# Patient Record
Sex: Female | Born: 2000 | Race: White | Hispanic: No | Marital: Single | Smoking: Never smoker
Health system: Southern US, Community
[De-identification: ages and names within clinical notes are randomized; demographics above are authoritative.]

## PROBLEM LIST (undated history)

## (undated) DIAGNOSIS — F819 Developmental disorder of scholastic skills, unspecified: Secondary | ICD-10-CM

## (undated) DIAGNOSIS — H9325 Central auditory processing disorder: Secondary | ICD-10-CM

## (undated) DIAGNOSIS — F32A Depression, unspecified: Secondary | ICD-10-CM

## (undated) DIAGNOSIS — F419 Anxiety disorder, unspecified: Secondary | ICD-10-CM

## (undated) DIAGNOSIS — M4120 Other idiopathic scoliosis, site unspecified: Secondary | ICD-10-CM

## (undated) DIAGNOSIS — F329 Major depressive disorder, single episode, unspecified: Secondary | ICD-10-CM

## (undated) HISTORY — PX: ADENOIDECTOMY: SUR15

## (undated) HISTORY — DX: Anxiety disorder, unspecified: F41.9

## (undated) HISTORY — DX: Developmental disorder of scholastic skills, unspecified: F81.9

## (undated) HISTORY — PX: WISDOM TOOTH EXTRACTION: SHX21

## (undated) HISTORY — DX: Central auditory processing disorder: H93.25

## (undated) HISTORY — DX: Other idiopathic scoliosis, site unspecified: M41.20

---
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
## 2019-01-04 ENCOUNTER — Ambulatory Visit (INDEPENDENT_AMBULATORY_CARE_PROVIDER_SITE_OTHER): Payer: BC Managed Care – PPO | Admitting: Family Medicine

## 2019-01-04 ENCOUNTER — Encounter: Payer: Self-pay | Admitting: Family Medicine

## 2019-01-04 ENCOUNTER — Other Ambulatory Visit: Payer: Self-pay

## 2019-01-04 VITALS — Temp 98.3°F

## 2019-01-04 DIAGNOSIS — F419 Anxiety disorder, unspecified: Secondary | ICD-10-CM

## 2019-01-04 NOTE — Progress Notes (Signed)
Virtual Visit via Video Note  I connected with Melissa EricNancy S Daniel   on 01/04/19 at 10:30 AM EDT by a video enabled telemedicine application and verified that I am speaking with the correct person using two identifiers.  Location patient: home Location provider:work office Persons participating in the virtual visit: patient, provider  I discussed the limitations of evaluation and management by telemedicine and the availability of in person appointments. The patient expressed understanding and agreed to proceed.   Melissa Ericancy S Daniel DOB: 04/25/2001 Encounter date: 01/04/2019  This is a 18 y.o. female who presents to establish care. No chief complaint on file.  Had virtual visit 11/17/18 with [facility]: started on tri-sprintec at that time to help with acne,dysmenrrhea, and for pregnancy prevention.  History of present illness: Anxiety: on prozac 40mg  daily. Follows with Franchot ErichsenKim Dansie (psychiatry) and has been following with her for 6 years. Doing better on this higher dose. Anxiety wasn't treated enough. COVID has made anxiety worse. Just had fear all the time about having symptoms and being sick. Was tested a couple of weeks ago and this was negative. Feeling well now.   Has had some allergy sx - nasal congestion, little cough. Thinks pollen was worse this year. Uses benadryl or claritin at night if needed.   She is planning to start college in Fall. 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   Doing well with the trisprintec - no problems.   Mild scoliosis - states that she just had xray the other day and that the scoliosis was doing better but she might have pinched nerve. Sees doctor in [place] for this. Ortho doctor.   Does exercise regularly - walk/job daily and yoga.    Past Medical History:  Diagnosis Date  . Anxiety   . Central auditory processing disorder    audiological, OT, full psych eval in 2008-2009  . Depression   . Idiopathic scoliosis    mild  . Problems  with learning    CAP, has tutor   Past Surgical History:  Procedure Laterality Date  . ADENOIDECTOMY    . TYMPANOSTOMY TUBE PLACEMENT  2003  . WISDOM TOOTH EXTRACTION     No Known Allergies Current Meds  Medication Sig  . FLUoxetine HCl (PROZAC PO) Take 60 mg by mouth daily.  Marland Kitchen. ibuprofen (ADVIL,MOTRIN) 200 MG tablet Take 200 mg by mouth as needed. Reported on 01/03/2016  . Norgestimate-Ethinyl Estradiol Triphasic (TRI-SPRINTEC) 0.18/0.215/0.25 MG-35 MCG tablet Take 1 tablet by mouth daily.  . Omega-3 Fatty Acids (FISH OIL PO) Take by mouth daily.  . [DISCONTINUED] FLUoxetine (PROZAC) 40 MG capsule Take 40 mg by mouth daily.   Social History   Tobacco Use  . Smoking status: Never Smoker  . Smokeless tobacco: Never Used  Substance Use Topics  . Alcohol use: Yes    Comment: rare 1 drink   Family History  Problem Relation Age of Onset  . Hypertension Father   . Liver disease Father   . Seizures Father   . Ankylosing spondylitis Father   . Lupus Paternal Grandmother   . Alzheimer's disease Maternal Grandmother      Review of Systems  Constitutional: Negative for chills, fatigue and fever.  Respiratory: Negative for cough, chest tightness, shortness of breath and wheezing.   Cardiovascular: Negative for chest pain, palpitations and leg swelling.    Objective:  Temp 98.3 F (36.8 C) Comment: taken by pt-jaf  LMP 12/09/2018       BP  Readings from Last 3 Encounters:  11/27/18 104/63  02/17/18 98/60  11/04/17 100/76 (9 %, Z = -1.32 /  85 %, Z = 1.05)*   *BP percentiles are based on the 2017 AAP Clinical Practice Guideline for girls   Wt Readings from Last 3 Encounters:  02/17/18 162 lb 1.6 oz (73.5 kg) (91 %, Z= 1.31)*  11/04/17 153 lb 8 oz (69.6 kg) (87 %, Z= 1.12)*  03/04/17 149 lb 9.6 oz (67.9 kg) (85 %, Z= 1.05)*   * Growth percentiles are based on CDC (Girls, 2-20 Years) data.    EXAM:  GENERAL: alert, oriented, appears well and in no acute  distress  HEENT: atraumatic, conjunctiva clear, no obvious abnormalities on inspection of external nose and ears  NECK: normal movements of the head and neck  LUNGS: on inspection no signs of respiratory distress, breathing rate appears normal, no obvious gross SOB, gasping or wheezing  CV: no obvious cyanosis  MS: moves all visible extremities without noticeable abnormality  PSYCH/NEURO: pleasant and cooperative, no obvious depression or anxiety, speech and thought processing grossly intact  Assessment/Plan: 1. Anxiety Doing well with Prozac.  See psychiatry on a regular basis.  Also encouraged her to look into the "insight timer" app which may help with meditation/anxiety.  Keep up with exercise and yoga as this is also a good stress relief.  She does need a physical prior to entering school.  She is going to look for the paperwork for this and we will try to get this scheduled before start of the school year in August. Looks like only immunization she is due for is MCV, but I have not seen paperwork to see if there are other requirements before starting school.   Return for physical prior to starting school (patient request).   I discussed the assessment and treatment plan with the patient. The patient was provided an opportunity to ask questions and all were answered. The patient agreed with the plan and demonstrated an understanding of the instructions.   The patient was advised to call back or seek an in-person evaluation if the symptoms worsen or if the condition fails to improve as anticipated.  I provided 15 minutes of non-face-to-face time during this encounter.   Micheline Rough, MD

## 2019-01-07 NOTE — Progress Notes (Signed)
yes

## 2019-01-09 ENCOUNTER — Telehealth: Payer: Self-pay | Admitting: *Deleted

## 2019-01-09 NOTE — Telephone Encounter (Signed)
-----   Message from Caren Macadam, MD sent at 01/07/2019 12:04 PM EDT -----   ----- Message ----- From: Agnes Lawrence, CMA Sent: 01/04/2019  11:02 AM EDT To: Caren Macadam, MD  There are no openings available.  Is it Ok to use a same day appt for this pt? Wendie Simmer  ----- Message ----- From: Caren Macadam, MD Sent: 01/04/2019  10:57 AM EDT To: Agnes Lawrence, CMA  Please set up school physical - she needs this before 3rd week in august.

## 2019-01-09 NOTE — Telephone Encounter (Signed)
I left a detailed message I scheduled an appt for 8/21 to arrive at 3pm for a physical as this is the only opening available.  I asked that the pt call the office to cancel if this is not a good day for her.

## 2019-01-12 DIAGNOSIS — F419 Anxiety disorder, unspecified: Secondary | ICD-10-CM | POA: Diagnosis not present

## 2019-01-13 ENCOUNTER — Telehealth: Payer: BC Managed Care – PPO | Admitting: Family

## 2019-01-13 ENCOUNTER — Encounter: Payer: Self-pay | Admitting: Family Medicine

## 2019-01-13 DIAGNOSIS — N898 Other specified noninflammatory disorders of vagina: Secondary | ICD-10-CM

## 2019-01-13 DIAGNOSIS — Z20822 Contact with and (suspected) exposure to covid-19: Secondary | ICD-10-CM

## 2019-01-13 DIAGNOSIS — R109 Unspecified abdominal pain: Secondary | ICD-10-CM

## 2019-01-13 DIAGNOSIS — N9419 Other specified dyspareunia: Secondary | ICD-10-CM

## 2019-01-13 MED ORDER — ALBUTEROL SULFATE HFA 108 (90 BASE) MCG/ACT IN AERS: 2.0000 | INHALATION_SPRAY | RESPIRATORY_TRACT | 2 refills | Status: DC | PRN

## 2019-01-13 MED ORDER — BENZONATATE 100 MG PO CAPS: 100.0000 mg | ORAL_CAPSULE | Freq: Three times a day (TID) | ORAL | 0 refills | Status: DC | PRN

## 2019-01-13 NOTE — Telephone Encounter (Signed)
How about 10:45 on July 29th? Looks like we could squeeze her in. Make sure she arrives ten minutes early and let her know we are squeezing in so to be a little patient with [place].   Please call her back to confirm

## 2019-01-13 NOTE — Progress Notes (Signed)
Thank you for the details you included in the comment boxes. Those details are very helpful in determining the best course of treatment for you and help [place] to provide the best care.  Due to your symptoms, we must perform a physical exam and lab work for safety reasons to ensure the infection is not more serious. This is the standard of care and cannot be performed remotely.   Based on what you shared with me, I feel your condition warrants further evaluation and I recommend that you be seen for a face to face office visit.  NOTE: If you entered your credit card information for this eVisit, you will not be charged. You may see a "hold" on your card for the $35 but that hold will drop off and you will not have a charge processed.  If you are having a true medical emergency please call 911.     For an urgent face to face visit, [facility] has five urgent care centers for your convenience:    [email or web address] to reserve your spot online an avoid wait times  [facility] (New Address!) [address] [place], [place] [address] *Just off [address], across the road from [place] hours of operation: Monday-Friday, 12 PM to 6 PM  Closed Saturday & Sunday   The following sites will take your insurance:  . [facility]    [phone]                  Get Driving Directions  [address], [place] [address] . 10 am to 8 pm Monday-Friday . 12 pm to 8 pm Saturday-Sunday   . [facility] at [place]                  Get Driving Directions  [address], [place], [place] [address] . 8 am to 8 pm Monday-Friday . 9 am to 6 pm Saturday . 11 am to 6 pm Sunday   . [facility] at [place]                  Get Driving Directions   [address] [place], [place] [address] . 8 am to 8 pm Monday-Friday .  8 am to 4 pm Saturday-Sunday    . [facility] at [place]                    Get Driving Directions  [phone]  [address], [place],  [address]  . Monday-Friday, 12 PM to 6 PM    Your e-visit answers were reviewed by a board certified advanced clinical practitioner to complete your personal care plan.  Thank you for using e-Visits.

## 2019-01-13 NOTE — Progress Notes (Signed)
Greater than 5 minutes, yet less than 10 minutes of time have been spent researching, coordinating, and implementing care for this patient today.  Thank you for the details you included in the comment boxes. Those details are very helpful in determining the best course of treatment for you and help us to provide the best care.  E-Visit for Corona Virus Screening   Your current symptoms could be consistent with the coronavirus.  Many health care providers can now test patients at their office but not all are.  [facility] has multiple testing sites. For information on our COVID testing locations and hours go to [email or web address]  Please quarantine yourself while awaiting your test results.  We are enrolling you in our MyChart Home Montioring for COVID19 . Daily you will receive a questionnaire within the MyChart website. Our COVID 19 response team willl be monitoriing your responses daily.    COVID-19 is a respiratory illness with symptoms that are similar to the flu. Symptoms are typically mild to moderate, but there have been cases of severe illness and death due to the virus. The following symptoms may appear 2-14 days after exposure: . Fever . Cough . Shortness of breath or difficulty breathing . Chills . Repeated shaking with chills . Muscle pain . Headache . Sore throat . New loss of taste or smell . Fatigue . Congestion or runny nose . Nausea or vomiting . Diarrhea  It is vitally important that if you feel that you have an infection such as this virus or any other virus that you stay home and away from places where you may spread it to others.  You should self-quarantine for 14 days if you have symptoms that could potentially be coronavirus or have been in close contact a with a person diagnosed with COVID-19 within the last 2 weeks. You should avoid contact with people age 65 and older.   You should wear a mask or cloth face covering over your nose and  mouth if you must be around other people or animals, including pets (even at home). Try to stay at least 6 feet away from other people. This will protect the people around you.  You can use medication such as A prescription cough medication called Tessalon Perles 100 mg. You may take 1-2 capsules every 8 hours as needed for cough and A prescription inhaler called Albuterol MDI 90 mcg /actuation 2 puffs every 4 hours as needed for shortness of breath, wheezing, cough  You may also take acetaminophen (Tylenol) as needed for fever.   Reduce your risk of any infection by using the same precautions used for avoiding the common cold or flu:  . Wash your hands often with soap and warm water for at least 20 seconds.  If soap and water are not readily available, use an alcohol-based hand sanitizer with at least 60% alcohol.  . If coughing or sneezing, cover your mouth and nose by coughing or sneezing into the elbow areas of your shirt or coat, into a tissue or into your sleeve (not your hands). . Avoid shaking hands with others and consider head nods or verbal greetings only. . Avoid touching your eyes, nose, or mouth with unwashed hands.  . Avoid close contact with people who are sick. . Avoid places or events with large numbers of people in one location, like concerts or sporting events. . Carefully consider travel plans you have or are making. . If you are planning any travel outside or inside the [place],   visit the CDC's Travelers' Health webpage for the latest health notices. . If you have some symptoms but not all symptoms, continue to monitor at home and seek medical attention if your symptoms worsen. . If you are having a medical emergency, call 911.  HOME CARE . Only take medications as instructed by your medical team. . Drink plenty of fluids and get plenty of rest. . A steam or ultrasonic humidifier can help if you have congestion.   GET HELP RIGHT AWAY IF YOU HAVE EMERGENCY WARNING SIGNS** FOR  COVID-19. If you or someone is showing any of these signs seek emergency medical care immediately. Call 911 or proceed to your closest emergency facility if: . You develop worsening high fever. . Trouble breathing . Bluish lips or face . Persistent pain or pressure in the chest . New confusion . Inability to wake or stay awake . You cough up blood. . Your symptoms become more severe  **This list is not all possible symptoms. Contact your medical provider for any symptoms that are sever or concerning to you.   MAKE SURE YOU   Understand these instructions.  Will watch your condition.  Will get help right away if you are not doing well or get worse.  Your e-visit answers were reviewed by a board certified advanced clinical practitioner to complete your personal care plan.  Depending on the condition, your plan could have included both over the counter or prescription medications.  If there is a problem please reply once you have received a response from your provider.  Your safety is important to us.  If you have drug allergies check your prescription carefully.    You can use MyChart to ask questions about today's visit, request a non-urgent call back, or ask for a work or school excuse for 24 hours related to this e-Visit. If it has been greater than 24 hours you will need to follow up with your provider, or enter a new e-Visit to address those concerns. You will get an e-mail in the next two days asking about your experience.  I hope that your e-visit has been valuable and will speed your recovery. Thank you for using e-visits.    

## 2019-01-14 DIAGNOSIS — R51 Headache: Secondary | ICD-10-CM | POA: Diagnosis not present

## 2019-01-14 DIAGNOSIS — Z20828 Contact with and (suspected) exposure to other viral communicable diseases: Secondary | ICD-10-CM | POA: Diagnosis not present

## 2019-01-14 DIAGNOSIS — J029 Acute pharyngitis, unspecified: Secondary | ICD-10-CM | POA: Diagnosis not present

## 2019-01-14 DIAGNOSIS — R05 Cough: Secondary | ICD-10-CM | POA: Diagnosis not present

## 2019-01-16 NOTE — Telephone Encounter (Signed)
Message sent to the pt via Mychart. 

## 2019-01-18 ENCOUNTER — Ambulatory Visit (INDEPENDENT_AMBULATORY_CARE_PROVIDER_SITE_OTHER): Payer: BC Managed Care – PPO | Admitting: Family Medicine

## 2019-01-18 ENCOUNTER — Other Ambulatory Visit: Payer: Self-pay

## 2019-01-18 ENCOUNTER — Encounter: Payer: Self-pay | Admitting: Family Medicine

## 2019-01-18 VITALS — BP 100/50 | HR 89 | Temp 97.2°F | Ht 66.25 in | Wt 151.2 lb

## 2019-01-18 DIAGNOSIS — F419 Anxiety disorder, unspecified: Secondary | ICD-10-CM

## 2019-01-18 DIAGNOSIS — M41124 Adolescent idiopathic scoliosis, thoracic region: Secondary | ICD-10-CM

## 2019-01-18 DIAGNOSIS — N946 Dysmenorrhea, unspecified: Secondary | ICD-10-CM

## 2019-01-18 DIAGNOSIS — Z0000 Encounter for general adult medical examination without abnormal findings: Secondary | ICD-10-CM

## 2019-01-18 DIAGNOSIS — Z23 Encounter for immunization: Secondary | ICD-10-CM | POA: Diagnosis not present

## 2019-01-18 NOTE — Progress Notes (Signed)
Brien Few DOB: [birth year] Encounter date: 01/18/2019  This is a 18 y.o. female who presents for complete physical   History of present illness/Additional concerns: Needing physical before return to school. Due for meningitis conjugate; mcv b if desired - would like both today.   Returns to school soon. Interested in Community education officer.   Had eye exam in January.   Saw densist a couple of weeks ago.   Exercising regularly.   Past Medical History:  Diagnosis Date  . Anxiety   . Central auditory processing disorder    audiological, OT, full psych eval in 2008-2009  . Depression   . Idiopathic scoliosis    mild  . Problems with learning    CAP, has tutor   Past Surgical History:  Procedure Laterality Date  . ADENOIDECTOMY    . TYMPANOSTOMY TUBE PLACEMENT  2003  . WISDOM TOOTH EXTRACTION     No Known Allergies Current Meds  Medication Sig  . FLUoxetine HCl (PROZAC PO) Take 60 mg by mouth daily.  Marland Kitchen ibuprofen (ADVIL,MOTRIN) 200 MG tablet Take 200 mg by mouth as needed. Reported on 01/03/2016  . Norgestimate-Ethinyl Estradiol Triphasic (TRI-SPRINTEC) 0.18/0.215/0.25 MG-35 MCG tablet Take 1 tablet by mouth daily.  . Omega-3 Fatty Acids (FISH OIL PO) Take by mouth daily.   Social History   Tobacco Use  . Smoking status: Never Smoker  . Smokeless tobacco: Never Used  Substance Use Topics  . Alcohol use: Yes    Comment: rare 1 drink   Family History  Problem Relation Age of Onset  . Hypertension Father   . Liver disease Father   . Seizures Father   . Ankylosing spondylitis Father   . Lupus Paternal Grandmother   . Alzheimer's disease Maternal Grandmother      Review of Systems  Constitutional: Negative for activity change, appetite change, chills, fatigue, fever and unexpected weight change.  HENT: Negative for congestion, ear pain, hearing loss, sinus pressure, sinus pain, sore throat and trouble swallowing.   Eyes: Negative for pain and visual disturbance.   Respiratory: Negative for cough, chest tightness, shortness of breath and wheezing.   Cardiovascular: Negative for chest pain, palpitations and leg swelling.  Gastrointestinal: Negative for abdominal pain, blood in stool, constipation, diarrhea, nausea and vomiting.  Genitourinary: Negative for difficulty urinating and menstrual problem.  Musculoskeletal: Positive for back pain (gets back pain on and off. has followed with scoliosis specialist. no recommended treatment). Negative for arthralgias.  Skin: Negative for rash.  Neurological: Negative for dizziness, weakness, numbness and headaches.  Hematological: Negative for adenopathy. Does not bruise/bleed easily.  Psychiatric/Behavioral: Negative for sleep disturbance and suicidal ideas. The patient is not nervous/anxious.     CBC:  Lab Results  Component Value Date   WBC 7.5 11/21/2015   HGB 14.2 11/21/2015   HCT 41.6 11/21/2015   MCH 29.4 11/21/2012   MCHC 34.0 11/21/2015   RDW 12.9 11/21/2015   PLT 265.0 11/21/2015   CMP: Lab Results  Component Value Date   NA 140 11/21/2015   K 4.4 11/21/2015   CL 102 11/21/2015   CO2 29 11/21/2015   GLUCOSE 78 11/21/2015   BUN 20 11/21/2015   CREATININE 0.77 11/21/2015   GFRAA NOT CALCULATED 11/21/2012   CALCIUM 9.7 11/21/2015   PROT 7.3 11/21/2012   BILITOT 0.2 (L) 11/21/2012   ALKPHOS 324 11/21/2012   ALT 14 11/21/2012   AST 22 11/21/2012   LIPID:No results found for: CHOL, TRIG, HDL, LDLCALC, LABVLDL  Objective:  BP (!) 100/50 (BP Location: Left Arm, Patient Position: Sitting, Cuff Size: Normal)   Pulse 89   Temp (!) 97.2 F (36.2 C) (Temporal)   Ht 5' 6.25" (1.683 m)   Wt 151 lb 3.2 oz (68.6 kg)   LMP 01/05/2019 (Exact Date)   SpO2 98%   BMI 24.22 kg/m   Weight: 151 lb 3.2 oz (68.6 kg)   BP Readings from Last 3 Encounters:  01/18/19 (!) 100/50  11/27/18 104/63  02/17/18 98/60   Wt Readings from Last 3 Encounters:  01/18/19 151 lb 3.2 oz (68.6 kg) (83 %, Z=  0.94)*  02/17/18 162 lb 1.6 oz (73.5 kg) (91 %, Z= 1.31)*  11/04/17 153 lb 8 oz (69.6 kg) (87 %, Z= 1.12)*   * Growth percentiles are based on CDC (Girls, 2-20 Years) data.    Physical Exam Constitutional:      General: She is not in acute distress.    Appearance: She is well-developed.  HENT:     Head: Normocephalic and atraumatic.     Right Ear: External ear normal.     Left Ear: External ear normal.     Mouth/Throat:     Pharynx: No oropharyngeal exudate.  Eyes:     Conjunctiva/sclera: Conjunctivae normal.     Pupils: Pupils are equal, round, and reactive to light.  Neck:     Musculoskeletal: Normal range of motion and neck supple.     Thyroid: No thyromegaly.  Cardiovascular:     Rate and Rhythm: Normal rate and regular rhythm.     Heart sounds: Normal heart sounds. No murmur. No friction rub. No gallop.   Pulmonary:     Effort: Pulmonary effort is normal.     Breath sounds: Normal breath sounds.  Abdominal:     General: Bowel sounds are normal. There is no distension.     Palpations: Abdomen is soft. There is no mass.     Tenderness: There is no abdominal tenderness. There is no guarding.     Hernia: No hernia is present.  Musculoskeletal: Normal range of motion.        General: No tenderness.     Thoracic back: She exhibits deformity.     Comments: Mild thoracic scoliosis noted  Lymphadenopathy:     Cervical: No cervical adenopathy.  Skin:    General: Skin is warm and dry.     Findings: No rash.  Neurological:     Mental Status: She is alert and oriented to person, place, and time.     Deep Tendon Reflexes: Reflexes normal.     Reflex Scores:      Tricep reflexes are 2+ on the right side and 2+ on the left side.      Bicep reflexes are 2+ on the right side and 2+ on the left side.      Brachioradialis reflexes are 2+ on the right side and 2+ on the left side.      Patellar reflexes are 2+ on the right side and 2+ on the left side. Psychiatric:        Speech:  Speech normal.        Behavior: Behavior normal.        Thought Content: Thought content normal.     Assessment/Plan: There are no preventive care reminders to display for this patient. Health Maintenance reviewed.  1. Preventative health care Keep up with exercise and healthy eating. We discussed considerations for school and being aware of mood changes/stressors.  2. Need for meningitis vaccination - Meningococcal MCV4O(Menveo) - Meningococcal B, OMV (Bexsero)  3. Anxiety Has been stable.  Continue with therapist.  4. Adolescent idiopathic scoliosis of thoracic region Encouraged her to consider chiropractor for back discomfort.  She had consider this earlier but was wondering if it would be a good choice of treatment.  I do think it would help with muscle imbalance secondary to scoliosis and also help with home exercises she continued to maintain good support/strength and flexibility of the spine.  5. Dysmenorrhea: should improve on OCP. She has only been on short time.  We discussed trying OCP at least 3 months to see how this affects menstrual cycle.  Encouraged her to consider ibuprofen prior to menses to help prevent cramping.  Suggested 400 to 600 mg twice daily 48 hours prior to first day of cycle.   Return in about 6 months (around 07/21/2019) for Chronic condition visit.  Theodis ShoveJunell Koberlein, MD

## 2019-01-19 ENCOUNTER — Encounter: Payer: Self-pay | Admitting: Family Medicine

## 2019-01-20 DIAGNOSIS — N760 Acute vaginitis: Secondary | ICD-10-CM | POA: Diagnosis not present

## 2019-01-20 DIAGNOSIS — Z113 Encounter for screening for infections with a predominantly sexual mode of transmission: Secondary | ICD-10-CM | POA: Diagnosis not present

## 2019-01-30 ENCOUNTER — Encounter: Payer: Self-pay | Admitting: Family Medicine

## 2019-01-30 DIAGNOSIS — M9901 Segmental and somatic dysfunction of cervical region: Secondary | ICD-10-CM | POA: Diagnosis not present

## 2019-01-30 DIAGNOSIS — M530 Cervicocranial syndrome: Secondary | ICD-10-CM | POA: Diagnosis not present

## 2019-01-30 DIAGNOSIS — M9903 Segmental and somatic dysfunction of lumbar region: Secondary | ICD-10-CM | POA: Diagnosis not present

## 2019-01-30 DIAGNOSIS — M9902 Segmental and somatic dysfunction of thoracic region: Secondary | ICD-10-CM | POA: Diagnosis not present

## 2019-02-01 DIAGNOSIS — M530 Cervicocranial syndrome: Secondary | ICD-10-CM | POA: Diagnosis not present

## 2019-02-01 DIAGNOSIS — M9903 Segmental and somatic dysfunction of lumbar region: Secondary | ICD-10-CM | POA: Diagnosis not present

## 2019-02-01 DIAGNOSIS — M9901 Segmental and somatic dysfunction of cervical region: Secondary | ICD-10-CM | POA: Diagnosis not present

## 2019-02-01 DIAGNOSIS — M9902 Segmental and somatic dysfunction of thoracic region: Secondary | ICD-10-CM | POA: Diagnosis not present

## 2019-02-08 ENCOUNTER — Other Ambulatory Visit: Payer: Self-pay

## 2019-02-08 ENCOUNTER — Ambulatory Visit (HOSPITAL_COMMUNITY)
Admission: EM | Admit: 2019-02-08 | Discharge: 2019-02-08 | Disposition: A | Discharge status: Home / Self Care | Payer: BC Managed Care – PPO | Attending: Family Medicine | Admitting: Family Medicine

## 2019-02-08 ENCOUNTER — Encounter (HOSPITAL_COMMUNITY): Payer: Self-pay

## 2019-02-08 DIAGNOSIS — Z20822 Contact with and (suspected) exposure to covid-19: Secondary | ICD-10-CM

## 2019-02-08 DIAGNOSIS — Z20828 Contact with and (suspected) exposure to other viral communicable diseases: Secondary | ICD-10-CM | POA: Diagnosis not present

## 2019-02-08 NOTE — ED Triage Notes (Signed)
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

## 2019-02-08 NOTE — Discharge Instructions (Signed)
Self isolate until results are back and negative.  Will notify you of any positive findings. You may monitor your results on your MyChart online as well.    Supportive cares- fluids, tylenol, ibuprofen, over the counter cough medications- as needed for any symptoms.  Please go to the er for any shortness of breath , severe chest pain , or otherwise worsening.

## 2019-02-08 NOTE — ED Provider Notes (Signed)
[facility]    CSN: [id number] Arrival date & time: 02/08/19  [id number]      History   Chief Complaint Chief Complaint  Patient presents with  . Covid Exposure    HPI Melissa Daniel is a 18 y.o. female.   Melissa Daniel presents with complaints of exposure to covid-19. She was at her friends house 8/16. They both tested for covid the following day, receiving results yesterday which were positive. Melissa Daniel denies any current symptoms and feels well. No URI symptoms, no gi symptom, no headache, fever. History  Of anxiety, depression, scoliosis.      ROS per HPI, negative if not otherwise mentioned.      Past Medical History:  Diagnosis Date  . Anxiety   . Central auditory processing disorder    audiological, OT, full psych eval in 2008-2009  . Depression   . Idiopathic scoliosis    mild  . Problems with learning    CAP, has tutor    Patient Active Problem List   Diagnosis Date Noted  . Abnormal endocrine laboratory test finding 01/03/2016  . Father with liver failure 12/22/2011  . Central auditory processing disorder   . Adolescent idiopathic scoliosis of thoracic region 11/26/2010  . Anxiety 11/26/2010    Past Surgical History:  Procedure Laterality Date  . ADENOIDECTOMY    . TYMPANOSTOMY TUBE PLACEMENT  2003  . WISDOM TOOTH EXTRACTION      OB History   No obstetric history on file.      Home Medications    Prior to Admission medications   Medication Sig Start Date End Date Taking? Authorizing Provider  FLUoxetine HCl (PROZAC PO) Take 60 mg by mouth daily.    [provider]  ibuprofen (ADVIL,MOTRIN) 200 MG tablet Take 200 mg by mouth as needed. Reported on 01/03/2016    [provider]  Norgestimate-Ethinyl Estradiol Triphasic (TRI-SPRINTEC) 0.18/0.215/0.25 MG-35 MCG tablet Take 1 tablet by mouth daily. 11/17/18   Terressa KoyanagiKim, Hannah R, DO  Omega-3 Fatty Acids (FISH OIL PO) Take by mouth daily.    [provider]     Family History Family History  Problem Relation Age of Onset  . Hypertension Father   . Liver disease Father   . Seizures Father   . Ankylosing spondylitis Father   . Lupus Paternal Grandmother   . Alzheimer's disease Maternal Grandmother     Social History Social History   Tobacco Use  . Smoking status: Never Smoker  . Smokeless tobacco: Never Used  Substance Use Topics  . Alcohol use: Yes    Comment: rare 1 drink  . Drug use: No     Allergies   Patient has no known allergies.   Review of Systems Review of Systems   Physical Exam Triage Vital Signs ED Triage Vitals  Enc Vitals Group     BP 02/08/19 0852 101/68     Pulse Rate 02/08/19 0852 85     Resp 02/08/19 0852 17     Temp 02/08/19 0852 98.7 F (37.1 C)     Temp Source 02/08/19 0852 Oral     SpO2 02/08/19 0852 99 %     Weight --      Height --      Head Circumference --      Peak Flow --      Pain Score 02/08/19 0854 0     Pain Loc --      Pain Edu? --  Excl. in GC? --    No data found.  Updated Vital Signs BP 101/68 (BP Location: Right Arm)   Pulse 85   Temp 98.7 F (37.1 C) (Oral)   Resp 17   LMP 02/02/2019   SpO2 99%    Physical Exam Constitutional:      General: She is not in acute distress.    Appearance: She is well-developed.  Cardiovascular:     Rate and Rhythm: Normal rate.  Pulmonary:     Effort: Pulmonary effort is normal.  Skin:    General: Skin is warm and dry.  Neurological:     Mental Status: She is alert and oriented to person, place, and time.      UC Treatments / Results  Labs (all labs ordered are listed, but only abnormal results are displayed) Labs Reviewed  NOVEL CORONAVIRUS, NAA (HOSPITAL ORDER, SEND-OUT TO REF LAB)    EKG   Radiology No results found.  Procedures Procedures (including critical care time)  Medications Ordered in UC Medications - No data to display  Initial Impression / Assessment and Plan / UC Course  I have reviewed  the triage vital signs and the nursing notes.  Pertinent labs & imaging results that were available during my care of the patient were reviewed by me and considered in my medical decision making (see chart for details).     Isolation until results back recommended. covid pending. Return precautions provided. Patient verbalized understanding and agreeable to plan.   Final Clinical Impressions(s) / UC Diagnoses   Final diagnoses:  Exposure to Covid-19 Virus     Discharge Instructions     Self isolate until results are back and negative.  Will notify you of any positive findings. You may monitor your results on your MyChart online as well.    Supportive cares- fluids, tylenol, ibuprofen, over the counter cough medications- as needed for any symptoms.  Please go to the er for any shortness of breath , severe chest pain , or otherwise worsening.    ED Prescriptions    None     Controlled Substance Prescriptions [place] Controlled Substance Registry consulted? Not Applicable   Zigmund Gottron, NP 02/08/19 [phone]

## 2019-02-10 ENCOUNTER — Encounter: Payer: Self-pay | Admitting: Family Medicine

## 2019-02-10 ENCOUNTER — Ambulatory Visit: Payer: Self-pay | Admitting: Family Medicine

## 2019-02-10 LAB — NOVEL CORONAVIRUS, NAA (HOSP ORDER, SEND-OUT TO REF LAB; TAT 18-24 HRS): SARS-CoV-2, NAA: NOT DETECTED

## 2019-02-13 ENCOUNTER — Encounter (HOSPITAL_COMMUNITY): Payer: Self-pay

## 2019-02-15 NOTE — Telephone Encounter (Signed)
If you can print and complete immunization info portion, I will complete physical portion.   Ok to schedule her for nurse visit for TB testing.

## 2019-02-16 ENCOUNTER — Encounter: Payer: Self-pay | Admitting: Family Medicine

## 2019-02-17 DIAGNOSIS — J3089 Other allergic rhinitis: Secondary | ICD-10-CM | POA: Diagnosis not present

## 2019-02-17 DIAGNOSIS — J309 Allergic rhinitis, unspecified: Secondary | ICD-10-CM | POA: Diagnosis not present

## 2019-02-17 DIAGNOSIS — H1045 Other chronic allergic conjunctivitis: Secondary | ICD-10-CM | POA: Diagnosis not present

## 2019-02-17 NOTE — Telephone Encounter (Signed)
done

## 2019-02-17 NOTE — Telephone Encounter (Signed)
My portion completed; just arrange TB testing. Papers returned to you.

## 2019-02-17 NOTE — Telephone Encounter (Signed)
I didn't see this in red folder; I have reprinted it. I will sign and we can send with other paperwork!

## 2019-02-20 ENCOUNTER — Encounter (INDEPENDENT_AMBULATORY_CARE_PROVIDER_SITE_OTHER): Payer: Self-pay

## 2019-02-20 ENCOUNTER — Telehealth: Payer: BC Managed Care – PPO | Admitting: Family

## 2019-02-20 DIAGNOSIS — J069 Acute upper respiratory infection, unspecified: Secondary | ICD-10-CM | POA: Diagnosis not present

## 2019-02-20 DIAGNOSIS — Z20822 Contact with and (suspected) exposure to covid-19: Secondary | ICD-10-CM

## 2019-02-20 DIAGNOSIS — R6889 Other general symptoms and signs: Secondary | ICD-10-CM

## 2019-02-20 MED ORDER — BENZONATATE 100 MG PO CAPS: 100.0000 mg | ORAL_CAPSULE | Freq: Three times a day (TID) | ORAL | 0 refills | Status: DC | PRN

## 2019-02-20 NOTE — Progress Notes (Signed)
E-Visit for Corona Virus Screening   Your current symptoms could be consistent with the coronavirus.  Many health care providers can now test patients at their office but not all are.  [facility] has multiple testing sites. For information on our COVID testing locations and hours go to [email or web address]  Please quarantine yourself while awaiting your test results.  We are enrolling you in our MyChart Home Montioring for COVID19 . Daily you will receive a questionnaire within the MyChart website. Our COVID 19 response team willl be monitoriing your responses daily.  Approximately 5 minutes was spent documenting and reviewing patient's chart.   You can go to one of the  testing sites listed below, while they are opened (see hours). You do not need an order and will stay in your car during the test. You do need to self isolate until your results return and if positive 14 days from when your symptoms started and until you are 3 days symptom free.   Testing Locations (Monday - Friday, 8 a.m. - 3:30 p.m.) . [place]: [facility], [address], [place], [place]  . [place]: [facility], [address], [place], [place] (entrance off [address])  . [place]: [address], [place], [place] (across from [facility])   COVID-19 is a respiratory illness with symptoms that are similar to the flu. Symptoms are typically mild to moderate, but there have been cases of severe illness and death due to the virus. The following symptoms may appear 2-14 days after exposure: . Fever . Cough . Shortness of breath or difficulty breathing . Chills . Repeated shaking with chills . Muscle pain . Headache . Sore throat . New loss of taste or smell . Fatigue . Congestion or runny nose . Nausea or vomiting . Diarrhea  It is vitally important that if you feel that you have an infection such  as this virus or any other virus that you stay home and away from places where you may spread it to others.  You should self-quarantine for 14 days if you have symptoms that could potentially be coronavirus or have been in close contact a with a person diagnosed with COVID-19 within the last 2 weeks. You should avoid contact with people age 65 and older.   You should wear a mask or cloth face covering over your nose and mouth if you must be around other people or animals, including pets (even at home). Try to stay at least 6 feet away from other people. This will protect the people around you.  You can use medication such as A prescription cough medication called Tessalon Perles 100 mg. You may take 1-2 capsules every 8 hours as needed for cough  You may also take acetaminophen (Tylenol) as needed for fever.   Reduce your risk of any infection by using the same precautions used for avoiding the common cold or flu:  . Wash your hands often with soap and warm water for at least 20 seconds.  If soap and water are not readily available, use an alcohol-based hand sanitizer with at least 60% alcohol.  . If coughing or sneezing, cover your mouth and nose by coughing or sneezing into the elbow areas of your shirt or coat, into a tissue or into your sleeve (not your hands). . Avoid shaking hands with others and consider head nods or verbal greetings only. . Avoid touching your eyes, nose, or mouth with   unwashed hands.  . Avoid close contact with people who are sick. . Avoid places or events with large numbers of people in one location, like concerts or sporting events. . Carefully consider travel plans you have or are making. . If you are planning any travel outside or inside the [place], visit the CDC's Travelers' Health webpage for the latest health notices. . If you have some symptoms but not all symptoms, continue to monitor at home and seek medical attention if your symptoms worsen. . If you are having a  medical emergency, call 911.  HOME CARE . Only take medications as instructed by your medical team. . Drink plenty of fluids and get plenty of rest. . A steam or ultrasonic humidifier can help if you have congestion.   GET HELP RIGHT AWAY IF YOU HAVE EMERGENCY WARNING SIGNS** FOR COVID-19. If you or someone is showing any of these signs seek emergency medical care immediately. Call 911 or proceed to your closest emergency facility if: . You develop worsening high fever. . Trouble breathing . Bluish lips or face . Persistent pain or pressure in the chest . New confusion . Inability to wake or stay awake . You cough up blood. . Your symptoms become more severe  **This list is not all possible symptoms. Contact your medical provider for any symptoms that are sever or concerning to you.   MAKE SURE YOU   Understand these instructions.  Will watch your condition.  Will get help right away if you are not doing well or get worse.  Your e-visit answers were reviewed by a board certified advanced clinical practitioner to complete your personal care plan.  Depending on the condition, your plan could have included both over the counter or prescription medications.  If there is a problem please reply once you have received a response from your provider.  Your safety is important to us.  If you have drug allergies check your prescription carefully.    You can use MyChart to ask questions about today's visit, request a non-urgent call back, or ask for a work or school excuse for 24 hours related to this e-Visit. If it has been greater than 24 hours you will need to follow up with your provider, or enter a new e-Visit to address those concerns. You will get an e-mail in the next two days asking about your experience.  I hope that your e-visit has been valuable and will speed your recovery. Thank you for using e-visits.    

## 2019-02-21 ENCOUNTER — Telehealth: Payer: Self-pay | Admitting: Family Medicine

## 2019-02-21 ENCOUNTER — Encounter (INDEPENDENT_AMBULATORY_CARE_PROVIDER_SITE_OTHER): Payer: Self-pay

## 2019-02-21 ENCOUNTER — Encounter: Payer: Self-pay | Admitting: Family Medicine

## 2019-02-21 NOTE — Telephone Encounter (Signed)
Patient has already been scheduled for a virtual visit.

## 2019-02-21 NOTE — Telephone Encounter (Signed)
Called the pt and offered an appt for today with Dr Maudie Mercury or tomorrow with Dr Ethlyn Gallery.  Appt scheduled for 9/2 at 9am per pts preference.

## 2019-02-22 ENCOUNTER — Telehealth: Payer: Self-pay | Admitting: Family Medicine

## 2019-02-22 ENCOUNTER — Encounter (INDEPENDENT_AMBULATORY_CARE_PROVIDER_SITE_OTHER): Payer: Self-pay

## 2019-02-24 ENCOUNTER — Encounter (INDEPENDENT_AMBULATORY_CARE_PROVIDER_SITE_OTHER): Payer: Self-pay

## 2019-02-24 ENCOUNTER — Encounter: Payer: Self-pay | Admitting: Family Medicine

## 2019-02-25 ENCOUNTER — Telehealth: Payer: Self-pay | Admitting: *Deleted

## 2019-02-25 ENCOUNTER — Encounter (INDEPENDENT_AMBULATORY_CARE_PROVIDER_SITE_OTHER): Payer: Self-pay

## 2019-02-25 NOTE — Telephone Encounter (Signed)
Best Practice advisory for new onset SOB and worsening cough received from MyChart COVID-19 symptom monitoring questionnaire.Pt called and states that her symptoms are not severe. SOB started today with doing activities. Cough is a little bit worse but has taken cough medication recently so it is a little better now. Pt advised to seed treatment in the UC/ED if SOB becomes worse. Pt advised to notify PCP if cough does not improve with the use of over the counter medications and if SOB continues. Pt verbalized understanding.

## 2019-02-26 ENCOUNTER — Encounter (INDEPENDENT_AMBULATORY_CARE_PROVIDER_SITE_OTHER): Payer: Self-pay

## 2019-02-27 ENCOUNTER — Encounter (INDEPENDENT_AMBULATORY_CARE_PROVIDER_SITE_OTHER): Payer: Self-pay

## 2019-02-28 ENCOUNTER — Encounter (INDEPENDENT_AMBULATORY_CARE_PROVIDER_SITE_OTHER): Payer: Self-pay

## 2019-03-01 ENCOUNTER — Encounter (INDEPENDENT_AMBULATORY_CARE_PROVIDER_SITE_OTHER): Payer: Self-pay

## 2019-03-02 ENCOUNTER — Encounter (INDEPENDENT_AMBULATORY_CARE_PROVIDER_SITE_OTHER): Payer: Self-pay

## 2019-03-03 ENCOUNTER — Encounter (INDEPENDENT_AMBULATORY_CARE_PROVIDER_SITE_OTHER): Payer: Self-pay

## 2019-03-03 DIAGNOSIS — F419 Anxiety disorder, unspecified: Secondary | ICD-10-CM | POA: Diagnosis not present

## 2019-03-08 ENCOUNTER — Ambulatory Visit (INDEPENDENT_AMBULATORY_CARE_PROVIDER_SITE_OTHER): Payer: BC Managed Care – PPO | Admitting: *Deleted

## 2019-03-08 ENCOUNTER — Other Ambulatory Visit: Payer: Self-pay

## 2019-03-08 DIAGNOSIS — Z23 Encounter for immunization: Secondary | ICD-10-CM

## 2019-03-08 NOTE — Progress Notes (Signed)
Patient in clinic today for Men B vaccine. Vaccine administered with no adverse reactions

## 2019-03-11 ENCOUNTER — Telehealth: Payer: BC Managed Care – PPO | Admitting: Family

## 2019-03-11 DIAGNOSIS — L739 Follicular disorder, unspecified: Secondary | ICD-10-CM

## 2019-03-11 MED ORDER — CEPHALEXIN 500 MG PO CAPS: 500.0000 mg | ORAL_CAPSULE | Freq: Three times a day (TID) | ORAL | 0 refills | Status: DC

## 2019-03-11 NOTE — Progress Notes (Signed)
E Visit for Rash  We are sorry that you are not feeling well. Here is how we plan to help!    Based upon what you have shared with me it looks like you have a bacterial follicultits.  Folliculitis is inflammation of the hair follicles that can be caused by a superficial infection of the skin and is treated with an antibiotic. I have prescribed: and Keflex 500 mg three times per day for 7 days  Approximately 5 minutes was spent documenting and reviewing patient's chart.   HOME CARE:   Take cool showers and avoid direct sunlight.  Apply cool compress or wet dressings.  Take a bath in an oatmeal bath.  Sprinkle content of one Aveeno packet under running faucet with comfortably warm water.  Bathe for 15-20 minutes, 1-2 times daily.  Pat dry with a towel. Do not rub the rash.  Use hydrocortisone cream.  Take an antihistamine like Benadryl for widespread rashes that itch.  The adult dose of Benadryl is 25-50 mg by mouth 4 times daily.  Caution:  This type of medication may cause sleepiness.  Do not drink alcohol, drive, or operate dangerous machinery while taking antihistamines.  Do not take these medications if you have prostate enlargement.  Read package instructions thoroughly on all medications that you take.  GET HELP RIGHT AWAY IF:   Symptoms don't go away after treatment.  Severe itching that persists.  If you rash spreads or swells.  If you rash begins to smell.  If it blisters and opens or develops a yellow-brown crust.  You develop a fever.  You have a sore throat.  You become short of breath.  MAKE SURE YOU:  Understand these instructions. Will watch your condition. Will get help right away if you are not doing well or get worse.  Thank you for choosing an e-visit. Your e-visit answers were reviewed by a board certified advanced clinical practitioner to complete your personal care plan. Depending upon the condition, your plan could have included both over the  counter or prescription medications. Please review your pharmacy choice. Be sure that the pharmacy you have chosen is open so that you can pick up your prescription now.  If there is a problem you may message your provider in [facility] to have the prescription routed to another pharmacy. Your safety is important to [place]. If you have drug allergies check your prescription carefully.  For the next 24 hours, you can use MyChart to ask questions about today's visit, request a non-urgent call back, or ask for a work or school excuse from your e-visit provider. You will get an email in the next two days asking about your experience. I hope that your e-visit has been valuable and will speed your recovery.

## 2019-03-25 DIAGNOSIS — M9902 Segmental and somatic dysfunction of thoracic region: Secondary | ICD-10-CM | POA: Diagnosis not present

## 2019-03-25 DIAGNOSIS — M9903 Segmental and somatic dysfunction of lumbar region: Secondary | ICD-10-CM | POA: Diagnosis not present

## 2019-03-25 DIAGNOSIS — M530 Cervicocranial syndrome: Secondary | ICD-10-CM | POA: Diagnosis not present

## 2019-03-25 DIAGNOSIS — M9901 Segmental and somatic dysfunction of cervical region: Secondary | ICD-10-CM | POA: Diagnosis not present

## 2019-04-04 ENCOUNTER — Other Ambulatory Visit: Payer: Self-pay

## 2019-04-04 ENCOUNTER — Encounter: Payer: Self-pay | Admitting: Family Medicine

## 2019-04-04 ENCOUNTER — Telehealth (INDEPENDENT_AMBULATORY_CARE_PROVIDER_SITE_OTHER): Payer: BC Managed Care – PPO | Admitting: Family Medicine

## 2019-04-04 DIAGNOSIS — Z3009 Encounter for other general counseling and advice on contraception: Secondary | ICD-10-CM | POA: Diagnosis not present

## 2019-04-04 DIAGNOSIS — N943 Premenstrual tension syndrome: Secondary | ICD-10-CM

## 2019-04-04 DIAGNOSIS — N946 Dysmenorrhea, unspecified: Secondary | ICD-10-CM | POA: Diagnosis not present

## 2019-04-04 NOTE — Progress Notes (Signed)
Virtual Visit via Video Note  I connected with Nan  on 04/04/19 at  1:00 PM EDT by a video enabled telemedicine application and verified that I am speaking with the correct person using two identifiers.  Location patient: home Location provider:work or home office Persons participating in the virtual visit: patient, provider  I discussed the limitations of evaluation and management by telemedicine and the availability of in person appointments. The patient expressed understanding and agreed to proceed.   HPI:  Acute visit for PMS and contraception counseling: -she gets emotional and gets cramps before her periods for several days -periods are regular, monthly and normal otherwise -she currently takes and OCP - she feels like these issues started with starting the OCP earlier this year -Her friend has an IUD instead and feels that this is a better choice, she wants my thoughts on various types of birthcontrol -she has a gynecologist as well  ROS: See pertinent positives and negatives per HPI.  Past Medical History:  Diagnosis Date  . Anxiety   . Central auditory processing disorder    audiological, OT, full psych eval in 2008-2009  . Depression   . Idiopathic scoliosis    mild  . Problems with learning    CAP, has tutor    Past Surgical History:  Procedure Laterality Date  . ADENOIDECTOMY    . TYMPANOSTOMY TUBE PLACEMENT  2003  . WISDOM TOOTH EXTRACTION      Family History  Problem Relation Age of Onset  . Hypertension Father   . Liver disease Father   . Seizures Father   . Ankylosing spondylitis Father   . Lupus Paternal Grandmother   . Alzheimer's disease Maternal Grandmother     SOCIAL HX: see hpi   Current Outpatient Medications:  .  FLUoxetine HCl (PROZAC PO), Take 60 mg by mouth daily., Disp: , Rfl:  .  ibuprofen (ADVIL,MOTRIN) 200 MG tablet, Take 200 mg by mouth as needed. Reported on 01/03/2016, Disp: , Rfl:  .  Norgestimate-Ethinyl Estradiol Triphasic  (TRI-SPRINTEC) 0.18/0.215/0.25 MG-35 MCG tablet, Take 1 tablet by mouth daily., Disp: 3 Package, Rfl: 3 .  Omega-3 Fatty Acids (FISH OIL PO), Take by mouth daily., Disp: , Rfl:   EXAM:  VITALS per patient if applicable:  GENERAL: alert, oriented, appears well and in no acute distress  HEENT: atraumatic, conjunttiva clear, no obvious abnormalities on inspection of external nose and ears  NECK: normal movements of the head and neck  LUNGS: on inspection no signs of respiratory distress, breathing rate appears normal, no obvious gross SOB, gasping or wheezing  CV: no obvious cyanosis  MS: moves all visible extremities without noticeable abnormality  PSYCH/NEURO: pleasant and cooperative, no obvious depression or anxiety, speech and thought processing grossly intact  ASSESSMENT AND PLAN:  Discussed the following assessment and plan:  Dysmenorrhea  PMS (premenstrual syndrome)  Encounter for counseling regarding contraception  -we discussed possible serious and likely etiologies, options for evaluation and workup, limitations of telemedicine visit vs in person visit, treatment, treatment risks and precautions. Pt prefers to treat via telemedicine empirically rather then risking or undertaking an in person visit at this moment. Discussed the various types of birth control, side effects, risks, etc. Advised contacting her gynecologist and also could try NSADIS and/or switching to a Drospirenone containing OCP instead of the current OCP. She agrees and plans to contact gyn first given her interest in IUDs. Advised to call if unable to resolve issues with gyn or any other issues  or concerns or ongoing symptoms.   I discussed the assessment and treatment plan with the patient. The patient was provided an opportunity to ask questions and all were answered. The patient agreed with the plan and demonstrated an understanding of the instructions.   The patient was advised to call back or seek an  in-person evaluation if the symptoms worsen or if the condition fails to improve as anticipated.   Terressa Koyanagi, DO

## 2019-04-13 DIAGNOSIS — F419 Anxiety disorder, unspecified: Secondary | ICD-10-CM | POA: Diagnosis not present

## 2019-04-17 ENCOUNTER — Telehealth (INDEPENDENT_AMBULATORY_CARE_PROVIDER_SITE_OTHER): Payer: BC Managed Care – PPO | Admitting: Family Medicine

## 2019-04-17 DIAGNOSIS — J029 Acute pharyngitis, unspecified: Secondary | ICD-10-CM

## 2019-04-17 DIAGNOSIS — R0981 Nasal congestion: Secondary | ICD-10-CM

## 2019-04-17 MED ORDER — FLUTICASONE PROPIONATE 50 MCG/ACT NA SUSP: 1.0000 | Freq: Every day | NASAL | 0 refills | Status: DC

## 2019-04-17 NOTE — Progress Notes (Signed)
Virtual Visit via Video Note  I connected with Melissa Daniel on 04/17/19 at  3:30 PM EDT by a video enabled telemedicine application 2/2 UMPNT-61 pandemic and verified that I am speaking with the correct person using two identifiers.  Location patient: home Location provider:work or home office Persons participating in the virtual visit: patient, provider  I discussed the limitations of evaluation and management by telemedicine and the availability of in person appointments. The patient expressed understanding and agreed to proceed.   HPI: Pt is a 18 yo female with pmh sig scoliosis, depression, central auditory processing d/o, and anxiety followed by Dr. Ethlyn Gallery.  Pt seen for acute concern.  Pt endorses sore throat and congestion x a few days.  Starting to get tinnitus in R ear.  Pt denies HA, fever, facial pain/ pressure, cough, n/v, diarrhea, sick contacts.  Pt tried benadryl.    ROS: See pertinent positives and negatives per HPI.  Past Medical History:  Diagnosis Date  . Anxiety   . Central auditory processing disorder    audiological, OT, full psych eval in 2008-2009  . Depression   . Idiopathic scoliosis    mild  . Problems with learning    CAP, has tutor    Past Surgical History:  Procedure Laterality Date  . ADENOIDECTOMY    . TYMPANOSTOMY TUBE PLACEMENT  2003  . WISDOM TOOTH EXTRACTION      Family History  Problem Relation Age of Onset  . Hypertension Father   . Liver disease Father   . Seizures Father   . Ankylosing spondylitis Father   . Lupus Paternal Grandmother   . Alzheimer's disease Maternal Grandmother      Current Outpatient Medications:  .  FLUoxetine HCl (PROZAC PO), Take 60 mg by mouth daily., Disp: , Rfl:  .  ibuprofen (ADVIL,MOTRIN) 200 MG tablet, Take 200 mg by mouth as needed. Reported on 01/03/2016, Disp: , Rfl:  .  Norgestimate-Ethinyl Estradiol Triphasic (TRI-SPRINTEC) 0.18/0.215/0.25 MG-35 MCG tablet, Take 1 tablet by mouth daily., Disp:  3 Package, Rfl: 3 .  Omega-3 Fatty Acids (FISH OIL PO), Take by mouth daily., Disp: , Rfl:   EXAM:  VITALS per patient if applicable: RR between 44-31 bpm  GENERAL: alert, oriented, appears well and in no acute distress  HEENT: atraumatic, conjunctiva clear, no obvious abnormalities on inspection of external nose and ears  NECK: normal movements of the head and neck  LUNGS: on inspection no signs of respiratory distress, breathing rate appears normal, no obvious gross SOB, gasping or wheezing  CV: no obvious cyanosis  MS: moves all visible extremities without noticeable abnormality  PSYCH/NEURO: pleasant and cooperative, no obvious depression or anxiety, speech and thought processing grossly intact  ASSESSMENT AND PLAN:  Discussed the following assessment and plan:  Nasal congestion -viral URI vs allergies -supportive care.  Gargling with warm salt water or chloraseptic spray, OTC cold meds, tylenol for any pain/discomfort. -for continued or worsening symptoms consider COVID testing. -given precautions - Plan: fluticasone (FLONASE) 50 MCG/ACT nasal spray  Sore throat -supportive care  F/u prn   I discussed the assessment and treatment plan with the patient. The patient was provided an opportunity to ask questions and all were answered. The patient agreed with the plan and demonstrated an understanding of the instructions.   The patient was advised to call back or seek an in-person evaluation if the symptoms worsen or if the condition fails to improve as anticipated.  Billie Ruddy, MD

## 2019-05-02 DIAGNOSIS — M9903 Segmental and somatic dysfunction of lumbar region: Secondary | ICD-10-CM | POA: Diagnosis not present

## 2019-05-02 DIAGNOSIS — M9901 Segmental and somatic dysfunction of cervical region: Secondary | ICD-10-CM | POA: Diagnosis not present

## 2019-05-02 DIAGNOSIS — M9902 Segmental and somatic dysfunction of thoracic region: Secondary | ICD-10-CM | POA: Diagnosis not present

## 2019-05-02 DIAGNOSIS — M530 Cervicocranial syndrome: Secondary | ICD-10-CM | POA: Diagnosis not present

## 2019-05-03 DIAGNOSIS — Z309 Encounter for contraceptive management, unspecified: Secondary | ICD-10-CM | POA: Diagnosis not present

## 2019-05-04 DIAGNOSIS — F419 Anxiety disorder, unspecified: Secondary | ICD-10-CM | POA: Diagnosis not present

## 2019-05-06 ENCOUNTER — Telehealth: Payer: BC Managed Care – PPO | Admitting: Nurse Practitioner

## 2019-05-06 DIAGNOSIS — H9209 Otalgia, unspecified ear: Secondary | ICD-10-CM

## 2019-05-06 NOTE — Progress Notes (Signed)
Based on what you shared with me it looks like you have inner ear infection or possible ear wax obstruction,that should be evaluated in a face to face office visit. You need someone to look inside your ear to see what is going on  Many offices offer virtual options to be seen via video if you are not comfortable going in person to a medical facility at this time.  If you do not have a PCP, [facility] offers a free physician referral service available at [phone]. Our trained staff has the experience, knowledge and resources to put you in touch with a physician who is right for you.   You also have the option of a video visit through [email or web address]  If you are having a true medical emergency please call 911.  NOTE: If you entered your credit card information for this eVisit, you will not be charged. You may see a "hold" on your card for the $35 but that hold will drop off and you will not have a charge processed.  Your e-visit answers were reviewed by a board certified advanced clinical practitioner to complete your personal care plan.  Thank you for using e-Visits.

## 2019-05-16 ENCOUNTER — Encounter: Payer: Self-pay | Admitting: Family Medicine

## 2019-05-16 ENCOUNTER — Ambulatory Visit: Payer: BC Managed Care – PPO | Admitting: Family Medicine

## 2019-05-16 ENCOUNTER — Other Ambulatory Visit: Payer: Self-pay

## 2019-05-16 VITALS — BP 118/74 | HR 90 | Temp 98.2°F | Ht 66.25 in | Wt 159.6 lb

## 2019-05-16 DIAGNOSIS — H6981 Other specified disorders of Eustachian tube, right ear: Secondary | ICD-10-CM | POA: Diagnosis not present

## 2019-05-16 NOTE — Progress Notes (Signed)
  Subjective:     Patient ID: Melissa Daniel, female   DOB: 2001-01-28, 18 y.o.   MRN: [id number]  HPI    Melissa Daniel is seen with some right ear symptoms for the past week.  She has had some mild discomfort and mild intermittent tinnitus but mostly sense of congestion.  She had some mild sinus congestion.  No fever.  No loss of taste or smell.  No fevers or chills.  She states she had T tubes back around age 16-3 but no recent infections.  No vertigo.  Took some Flonase last week but only infrequently and not consistently.  She does have some seasonal allergies but these are usually worse in September  Past Medical History:  Diagnosis Date  . Anxiety   . Central auditory processing disorder    audiological, OT, full psych eval in 2008-2009  . Depression   . Idiopathic scoliosis    mild  . Problems with learning    CAP, has tutor   Past Surgical History:  Procedure Laterality Date  . ADENOIDECTOMY    . TYMPANOSTOMY TUBE PLACEMENT  2003  . WISDOM TOOTH EXTRACTION      reports that she has never smoked. She has never used smokeless tobacco. She reports current alcohol use. She reports that she does not use drugs. family history includes Alzheimer's disease in her maternal grandmother; Ankylosing spondylitis in her father; Hypertension in her father; Liver disease in her father; Lupus in her paternal grandmother; Seizures in her father. No Known Allergies   Review of Systems  Constitutional: Negative for chills and fever.  HENT: Positive for congestion and tinnitus. Negative for ear discharge, hearing loss, sinus pressure and sore throat.   Respiratory: Negative for shortness of breath.   Neurological: Negative for headaches.       Objective:   Physical Exam Vitals signs reviewed.  Constitutional:      Appearance: Normal appearance.  HENT:     Right Ear: Tympanic membrane and ear canal normal.     Left Ear: Tympanic membrane and ear canal normal.  Cardiovascular:     Rate and  Rhythm: Normal rate and regular rhythm.  Pulmonary:     Effort: Pulmonary effort is normal.     Breath sounds: Normal breath sounds.  Neurological:     Mental Status: She is alert.        Assessment:     Right ear congestion symptoms.  No evidence for otitis media.  No obvious effusion.  No cerumen issues.  Question eustachian tube dysfunction    Plan:     -Recommend she stay on Flonase and take this daily for the next couple weeks -Consider over-the-counter supplement with Sudafed but watch for any side effects such as insomnia -Touch base for any persistent or worsening symptoms  Eulas Post MD [facility]

## 2019-05-16 NOTE — Patient Instructions (Signed)
Stay on the Flonase daily for the next couple of weeks.  Consider over the counter Sudafed - but watch for any insomnia.

## 2019-05-25 DIAGNOSIS — F419 Anxiety disorder, unspecified: Secondary | ICD-10-CM | POA: Diagnosis not present

## 2019-06-02 DIAGNOSIS — H1045 Other chronic allergic conjunctivitis: Secondary | ICD-10-CM | POA: Diagnosis not present

## 2019-06-02 DIAGNOSIS — J3089 Other allergic rhinitis: Secondary | ICD-10-CM | POA: Diagnosis not present

## 2019-06-02 DIAGNOSIS — J309 Allergic rhinitis, unspecified: Secondary | ICD-10-CM | POA: Diagnosis not present

## 2019-07-10 DIAGNOSIS — H5203 Hypermetropia, bilateral: Secondary | ICD-10-CM | POA: Diagnosis not present

## 2019-07-10 DIAGNOSIS — H52223 Regular astigmatism, bilateral: Secondary | ICD-10-CM | POA: Diagnosis not present

## 2019-07-11 DIAGNOSIS — N760 Acute vaginitis: Secondary | ICD-10-CM | POA: Diagnosis not present

## 2019-07-11 DIAGNOSIS — Z113 Encounter for screening for infections with a predominantly sexual mode of transmission: Secondary | ICD-10-CM | POA: Diagnosis not present

## 2019-07-20 DIAGNOSIS — F419 Anxiety disorder, unspecified: Secondary | ICD-10-CM | POA: Diagnosis not present

## 2019-07-31 ENCOUNTER — Telehealth (INDEPENDENT_AMBULATORY_CARE_PROVIDER_SITE_OTHER): Payer: BC Managed Care – PPO | Admitting: Family Medicine

## 2019-07-31 ENCOUNTER — Other Ambulatory Visit: Payer: Self-pay

## 2019-07-31 ENCOUNTER — Telehealth: Payer: Self-pay | Admitting: *Deleted

## 2019-07-31 DIAGNOSIS — J039 Acute tonsillitis, unspecified: Secondary | ICD-10-CM | POA: Diagnosis not present

## 2019-07-31 MED ORDER — AMOXICILLIN 500 MG PO CAPS: 500.0000 mg | ORAL_CAPSULE | Freq: Three times a day (TID) | ORAL | 0 refills | Status: DC

## 2019-07-31 NOTE — Telephone Encounter (Signed)
Patient called after hours line. Patient reports she is having pain in her right ear that started Friday, No drainage. No fever. She is also having some right throat pain.

## 2019-07-31 NOTE — Telephone Encounter (Signed)
Spoke with the pt and scheduled an appt for today at 4pm with Dr Clent Ridges as PCP does not have any openings.

## 2019-08-01 NOTE — Progress Notes (Signed)
Virtual Visit via Video Note  I connected with the patient on 08/01/19 at  4:00 PM EST by a video enabled telemedicine application and verified that I am speaking with the correct person using two identifiers.  Location patient: home Location provider:work or home office Persons participating in the virtual visit: patient, provider  I discussed the limitations of evaluation and management by telemedicine and the availability of in person appointments. The patient expressed understanding and agreed to proceed.   HPI: Here for 3 days of pain in the right side of her throat, pain in the right ear, and a dry cough. No headache or fever. No body aches or loss of taste or smell. No SOB or NVD. Taking Tylenol and drinking fluids. She still has her tonsils. She got strep throats frequently as a child.    ROS: See pertinent positives and negatives per HPI.  Past Medical History:  Diagnosis Date  . Anxiety   . Central auditory processing disorder    audiological, OT, full psych eval in 2008-2009  . Depression   . Idiopathic scoliosis    mild  . Problems with learning    CAP, has tutor    Past Surgical History:  Procedure Laterality Date  . ADENOIDECTOMY    . TYMPANOSTOMY TUBE PLACEMENT  2003  . WISDOM TOOTH EXTRACTION      Family History  Problem Relation Age of Onset  . Hypertension Father   . Liver disease Father   . Seizures Father   . Ankylosing spondylitis Father   . Lupus Paternal Grandmother   . Alzheimer's disease Maternal Grandmother      Current Outpatient Medications:  .  albuterol (VENTOLIN HFA) 108 (90 Base) MCG/ACT inhaler, , Disp: , Rfl:  .  amoxicillin (AMOXIL) 500 MG capsule, Take 1 capsule (500 mg total) by mouth 3 (three) times daily., Disp: 30 capsule, Rfl: 0 .  Dapsone 5 % topical gel, , Disp: , Rfl:  .  diphenhydrAMINE (BENADRYL ALLERGY) 25 mg capsule, , Disp: , Rfl:  .  FLUoxetine HCl 60 MG TABS, TK 1 T PO D, Disp: , Rfl:  .  fluticasone (FLONASE) 50  MCG/ACT nasal spray, Place 1 spray into both nostrils daily., Disp: 16 g, Rfl: 0 .  hydrOXYzine (VISTARIL) 25 MG capsule, Take 25 mg by mouth 2 (two) times daily as needed., Disp: , Rfl:  .  ibuprofen (ADVIL,MOTRIN) 200 MG tablet, Take 200 mg by mouth as needed. Reported on 01/03/2016, Disp: , Rfl:  .  Norgestimate-Ethinyl Estradiol Triphasic (TRI-SPRINTEC) 0.18/0.215/0.25 MG-35 MCG tablet, Take 1 tablet by mouth daily., Disp: 3 Package, Rfl: 3 .  Omega-3 Fatty Acids (FISH OIL PO), Take by mouth daily., Disp: , Rfl:  .  PREVIDENT 5000 BOOSTER PLUS 1.1 % PSTE, BRUSH WITH PEA-SIZED AMT BID AND SPIT. DO NOT EAT OR DRINK FOR 30 MIN AFTER, Disp: , Rfl:  .  tretinoin (RETIN-A) 0.05 % cream, , Disp: , Rfl:   EXAM:  VITALS per patient if applicable:  GENERAL: alert, oriented, appears well and in no acute distress  HEENT: atraumatic, conjunttiva clear, no obvious abnormalities on inspection of external nose and ears  NECK: normal movements of the head and neck  LUNGS: on inspection no signs of respiratory distress, breathing rate appears normal, no obvious gross SOB, gasping or wheezing  CV: no obvious cyanosis  MS: moves all visible extremities without noticeable abnormality  PSYCH/NEURO: pleasant and cooperative, no obvious depression or anxiety, speech and thought processing grossly intact  ASSESSMENT AND PLAN: Possible tonsillitis, treat with Amoxicillin. Recheck as needed.  Alysia Penna, MD  Discussed the following assessment and plan:  No diagnosis found.     I discussed the assessment and treatment plan with the patient. The patient was provided an opportunity to ask questions and all were answered. The patient agreed with the plan and demonstrated an understanding of the instructions.   The patient was advised to call back or seek an in-person evaluation if the symptoms worsen or if the condition fails to improve as anticipated.

## 2019-09-18 ENCOUNTER — Telehealth: Payer: Self-pay | Admitting: Family Medicine

## 2019-09-18 NOTE — Telephone Encounter (Addendum)
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 [facility] does not have any availability for a physical until later July, pt is wondering if she can be worked in sooner.

## 2019-09-25 DIAGNOSIS — L0109 Other impetigo: Secondary | ICD-10-CM | POA: Diagnosis not present

## 2019-09-25 NOTE — Telephone Encounter (Signed)
Spoke with the pt and scheduled an appt for 5/3 to arrive at 8:15am.

## 2019-09-25 NOTE — Telephone Encounter (Signed)
You can put her in any 30 min slot

## 2019-09-25 NOTE — Telephone Encounter (Signed)
I left a message for the pt to return my call. 

## 2019-10-05 DIAGNOSIS — F419 Anxiety disorder, unspecified: Secondary | ICD-10-CM | POA: Diagnosis not present

## 2019-10-07 DIAGNOSIS — R519 Headache, unspecified: Secondary | ICD-10-CM | POA: Diagnosis not present

## 2019-10-07 DIAGNOSIS — M9902 Segmental and somatic dysfunction of thoracic region: Secondary | ICD-10-CM | POA: Diagnosis not present

## 2019-10-07 DIAGNOSIS — M9903 Segmental and somatic dysfunction of lumbar region: Secondary | ICD-10-CM | POA: Diagnosis not present

## 2019-10-07 DIAGNOSIS — M9901 Segmental and somatic dysfunction of cervical region: Secondary | ICD-10-CM | POA: Diagnosis not present

## 2019-10-09 DIAGNOSIS — N760 Acute vaginitis: Secondary | ICD-10-CM | POA: Diagnosis not present

## 2019-10-09 DIAGNOSIS — R309 Painful micturition, unspecified: Secondary | ICD-10-CM | POA: Diagnosis not present

## 2019-10-23 ENCOUNTER — Encounter: Payer: Self-pay | Admitting: Family Medicine

## 2019-10-23 ENCOUNTER — Ambulatory Visit (INDEPENDENT_AMBULATORY_CARE_PROVIDER_SITE_OTHER): Payer: BC Managed Care – PPO | Admitting: Family Medicine

## 2019-10-23 ENCOUNTER — Other Ambulatory Visit: Payer: Self-pay

## 2019-10-23 VITALS — BP 110/62 | HR 94 | Temp 97.6°F | Ht 66.0 in | Wt 172.8 lb

## 2019-10-23 DIAGNOSIS — Z13220 Encounter for screening for lipoid disorders: Secondary | ICD-10-CM

## 2019-10-23 DIAGNOSIS — Z0000 Encounter for general adult medical examination without abnormal findings: Secondary | ICD-10-CM

## 2019-10-23 DIAGNOSIS — F419 Anxiety disorder, unspecified: Secondary | ICD-10-CM

## 2019-10-23 DIAGNOSIS — N946 Dysmenorrhea, unspecified: Secondary | ICD-10-CM | POA: Diagnosis not present

## 2019-10-23 DIAGNOSIS — Z131 Encounter for screening for diabetes mellitus: Secondary | ICD-10-CM

## 2019-10-23 LAB — CBC WITH DIFFERENTIAL/PLATELET
Basophils Absolute: 0 10*3/uL (ref 0.0–0.1)
Basophils Relative: 0.6 % (ref 0.0–3.0)
Eosinophils Absolute: 0.1 10*3/uL (ref 0.0–0.7)
Eosinophils Relative: 1.7 % (ref 0.0–5.0)
HCT: 38.5 % (ref 36.0–49.0)
Hemoglobin: 13.3 g/dL (ref 12.0–16.0)
Lymphocytes Relative: 37.2 % (ref 24.0–48.0)
Lymphs Abs: 3.1 10*3/uL (ref 0.7–4.0)
MCHC: 34.6 g/dL (ref 31.0–37.0)
MCV: 86.9 fl (ref 78.0–98.0)
Monocytes Absolute: 0.5 10*3/uL (ref 0.1–1.0)
Monocytes Relative: 6.2 % (ref 3.0–12.0)
Neutro Abs: 4.6 10*3/uL (ref 1.4–7.7)
Neutrophils Relative %: 54.3 % (ref 43.0–71.0)
Platelets: 224 10*3/uL (ref 150.0–575.0)
RBC: 4.43 Mil/uL (ref 3.80–5.70)
RDW: 12.6 % (ref 11.4–15.5)
WBC: 8.4 10*3/uL (ref 4.5–13.5)

## 2019-10-23 LAB — COMPREHENSIVE METABOLIC PANEL
ALT: 11 U/L (ref 0–35)
AST: 15 U/L (ref 0–37)
Albumin: 4 g/dL (ref 3.5–5.2)
Alkaline Phosphatase: 49 U/L (ref 47–119)
BUN: 12 mg/dL (ref 6–23)
CO2: 26 mEq/L (ref 19–32)
Calcium: 9.3 mg/dL (ref 8.4–10.5)
Chloride: 103 mEq/L (ref 96–112)
Creatinine, Ser: 0.81 mg/dL (ref 0.40–1.20)
GFR: 90.31 mL/min (ref >60.00)
Glucose, Bld: 109 mg/dL — ABNORMAL HIGH (ref 70–99)
Potassium: 3.7 mEq/L (ref 3.5–5.1)
Sodium: 139 mEq/L (ref 135–145)
Total Bilirubin: 0 mg/dL — ABNORMAL LOW (ref 0.2–1.2)
Total Protein: 6.5 g/dL (ref 6.0–8.3)

## 2019-10-23 LAB — LIPID PANEL
Cholesterol: 202 mg/dL — ABNORMAL HIGH (ref 0–200)
HDL: 66.5 mg/dL (ref >39.00)
LDL Cholesterol: 99 mg/dL (ref 0–99)
NonHDL: 135.53
Total CHOL/HDL Ratio: 3
Triglycerides: 182 mg/dL — ABNORMAL HIGH (ref 0.0–149.0)
VLDL: 36.4 mg/dL (ref 0.0–40.0)

## 2019-10-23 LAB — TSH: TSH: 1.34 u[IU]/mL (ref 0.40–5.00)

## 2019-10-23 NOTE — Progress Notes (Signed)
Melissa Daniel DOB: 09/01/2000 Encounter date: 10/23/2019  This is a 19 y.o. female who presents with Chief Complaint  Patient presents with  . 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   Mood has been doing "good". This last year has been crazy, but better last couple of months. Was switched from prozac to lexapro recently. Feels like this has helped with mood a lot. Seeing psychiatry regularly. Does therapy q 2-3 weeks. School was pretty good overall. Did fine academically. Socially more difficult.   Attending [employer]. Not completely sure what she wants to major in.   Switched from tri-sprintec to the yaz which seemed to help more with mood. Does feel like it has helped. Still gets bad period cramps.   No concerns with skin; using retin A.   No Known Allergies Current Meds  Medication Sig  . Dapsone 5 % topical gel   . diphenhydrAMINE (BENADRYL ALLERGY) 25 mg capsule   . drospirenone-ethinyl estradiol (YAZ) 3-0.02 MG tablet Take 1 tablet by mouth daily.  Marland Kitchen escitalopram (LEXAPRO) 20 MG tablet Take 20 mg by mouth daily.  . fluticasone (FLONASE) 50 MCG/ACT nasal spray Place 1 spray into both nostrils daily.  . hydrOXYzine (VISTARIL) 25 MG capsule Take 25 mg by mouth 2 (two) times daily as needed.  Marland Kitchen ibuprofen (ADVIL,MOTRIN) 200 MG tablet Take 200 mg by mouth as needed. Reported on 01/03/2016  . levocetirizine (XYZAL) 5 MG tablet Take 5 mg by mouth every evening.  . Omega-3 Fatty Acids (FISH OIL PO) Take by mouth daily.  Marland Kitchen PREVIDENT 5000 BOOSTER PLUS 1.1 % PSTE BRUSH WITH PEA-SIZED AMT BID AND SPIT. DO NOT EAT OR DRINK FOR 30 MIN AFTER  . tretinoin (RETIN-A) 0.05 % cream     Review of Systems  Constitutional: Negative for activity change, appetite change, chills, fatigue, fever and unexpected weight change.  HENT: Negative for congestion, ear pain, hearing  loss, sinus pressure, sinus pain, sore throat and trouble swallowing.   Eyes: Negative for pain and visual disturbance.  Respiratory: Negative for cough, chest tightness, shortness of breath and wheezing.   Cardiovascular: Negative for chest pain, palpitations and leg swelling.  Gastrointestinal: Negative for abdominal pain, blood in stool, constipation, diarrhea, nausea and vomiting.  Genitourinary: Negative for difficulty urinating and menstrual problem.  Musculoskeletal: Negative for arthralgias and back pain.  Skin: Negative for rash.  Neurological: Negative for dizziness, weakness, numbness and headaches.  Hematological: Negative for adenopathy. Does not bruise/bleed easily.  Psychiatric/Behavioral: Negative for sleep disturbance and suicidal ideas. The patient is not nervous/anxious.     Objective:  BP 110/62 (BP Location: Left Arm, Patient Position: Sitting, Cuff Size: Normal)   Pulse 94   Temp 97.6 F (36.4 C) (Temporal)   Ht 5\' 6"  (1.676 m)   Wt 172 lb 12.8 oz (78.4 kg)   LMP 10/16/2019 (Exact Date)   BMI 27.89 kg/m   Weight: 172 lb 12.8 oz (78.4 kg)   BP Readings from Last 3 Encounters:  10/23/19 110/62  05/16/19 118/74  02/08/19 101/68   Wt Readings from Last 3 Encounters:  10/23/19 172 lb 12.8 oz (78.4 kg) (93 %, Z= 1.44)*  05/16/19 159 lb 9.6 oz (72.4 kg) (88 %, Z= 1.15)*  01/18/19 151 lb 3.2 oz (68.6 kg) (83 %, Z= 0.94)*   * Growth percentiles are based on CDC (  Girls, 2-20 Years) data.    Physical Exam Constitutional:      General: She is not in acute distress.    Appearance: She is well-developed.  HENT:     Head: Normocephalic and atraumatic.     Right Ear: External ear normal.     Left Ear: External ear normal.     Mouth/Throat:     Pharynx: No oropharyngeal exudate.  Eyes:     Conjunctiva/sclera: Conjunctivae normal.     Pupils: Pupils are equal, round, and reactive to light.  Neck:     Thyroid: No thyromegaly.  Cardiovascular:     Rate and  Rhythm: Normal rate and regular rhythm.     Heart sounds: Normal heart sounds. No murmur. No friction rub. No gallop.   Pulmonary:     Effort: Pulmonary effort is normal.     Breath sounds: Normal breath sounds.  Abdominal:     General: Bowel sounds are normal. There is no distension.     Palpations: Abdomen is soft. There is no mass.     Tenderness: There is no abdominal tenderness. There is no guarding.     Hernia: No hernia is present.  Musculoskeletal:        General: No tenderness or deformity. Normal range of motion.     Cervical back: Normal range of motion and neck supple.  Lymphadenopathy:     Cervical: No cervical adenopathy.  Skin:    General: Skin is warm and dry.     Findings: No rash.  Neurological:     Mental Status: She is alert and oriented to person, place, and time.     Deep Tendon Reflexes: Reflexes normal.     Reflex Scores:      Tricep reflexes are 2+ on the right side and 2+ on the left side.      Bicep reflexes are 2+ on the right side and 2+ on the left side.      Brachioradialis reflexes are 2+ on the right side and 2+ on the left side.      Patellar reflexes are 2+ on the right side and 2+ on the left side. Psychiatric:        Speech: Speech normal.        Behavior: Behavior normal.        Thought Content: Thought content normal.     Assessment/Plan 1. Preventative health care Keep up with regular exercise and healthy eating.  She is doing well overall with mood, and at school.  2. Dysmenorrhea She is following with gynecology.  She is premedicating with menstrual cycle with ibuprofen.  Periods are better controlled with yaz and this seems to be helping more with mood. - CBC with Differential/Platelet; Future - TSH; Future - TSH - CBC with Differential/Platelet  3. Lipid screening - Lipid panel; Future - Lipid panel  4. Anxiety Well-controlled.  Seeing psychiatry on a regular basis.  Doing well with Lexapro.  Also following with therapist  regularly.  5. Screening for diabetes mellitus - Comprehensive metabolic panel; Future - Comprehensive metabolic panel    Return in about 6 months (around 04/24/2020) for Chronic condition visit. Camp form completed today.    Micheline Rough, MD

## 2019-10-24 ENCOUNTER — Other Ambulatory Visit (INDEPENDENT_AMBULATORY_CARE_PROVIDER_SITE_OTHER): Payer: BC Managed Care – PPO

## 2019-10-24 DIAGNOSIS — R7309 Other abnormal glucose: Secondary | ICD-10-CM | POA: Diagnosis not present

## 2019-10-24 LAB — HEMOGLOBIN A1C: Hgb A1c MFr Bld: 5 % (ref 4.6–6.5)

## 2019-10-24 NOTE — Telephone Encounter (Signed)
Per Laney Potash the test below will be added.  Message forwarded to PCP.

## 2019-10-25 ENCOUNTER — Encounter: Payer: Self-pay | Admitting: Family Medicine

## 2019-10-25 ENCOUNTER — Telehealth (INDEPENDENT_AMBULATORY_CARE_PROVIDER_SITE_OTHER): Payer: BC Managed Care – PPO | Admitting: Family Medicine

## 2019-10-25 VITALS — Temp 98.2°F | Wt 172.0 lb

## 2019-10-25 DIAGNOSIS — Z03818 Encounter for observation for suspected exposure to other biological agents ruled out: Secondary | ICD-10-CM | POA: Diagnosis not present

## 2019-10-25 DIAGNOSIS — J069 Acute upper respiratory infection, unspecified: Secondary | ICD-10-CM | POA: Diagnosis not present

## 2019-10-25 DIAGNOSIS — Z20828 Contact with and (suspected) exposure to other viral communicable diseases: Secondary | ICD-10-CM | POA: Diagnosis not present

## 2019-10-25 DIAGNOSIS — Z7189 Other specified counseling: Secondary | ICD-10-CM | POA: Diagnosis not present

## 2019-10-25 NOTE — Progress Notes (Signed)
Virtual Visit via Video Note  I connected with Melissa Daniel on 10/25/19 at  4:00 PM EDT by a video enabled telemedicine application 2/2 COVID-19 pandemic and verified that I am speaking with the correct person using two identifiers.  Location patient: home Location provider:work or home office Persons participating in the virtual visit: patient, provider  I discussed the limitations of evaluation and management by telemedicine and the availability of in person appointments. The patient expressed understanding and agreed to proceed.   HPI: Pt is a 19 yo female with pmh sig for central auditory processing d/o, h/o depression, h/o anxiety, seasonal allergies followed by Dr. Hassan Rowan who was seen for acute concern.  Pt with sore throat, congestion, cough, fatigue, ear pressure, frontal facial pain, and n x a few days.  Denies fever, diarrhea, v, sick contacts.  Pt taking Mucinex, dayquil, and nyquil.  Pt had second COVID vaccine on 4/3.   ROS: See pertinent positives and negatives per HPI.  Past Medical History:  Diagnosis Date  . Anxiety   . Central auditory processing disorder    audiological, OT, full psych eval in 2008-2009  . Depression   . Idiopathic scoliosis    mild  . Problems with learning    CAP, has tutor    Past Surgical History:  Procedure Laterality Date  . ADENOIDECTOMY    . TYMPANOSTOMY TUBE PLACEMENT  2003  . WISDOM TOOTH EXTRACTION      Family History  Problem Relation Age of Onset  . Hypertension Father   . Liver disease Father   . Seizures Father   . Ankylosing spondylitis Father   . Lupus Paternal Grandmother   . Alzheimer's disease Maternal Grandmother      Current Outpatient Medications:  .  Dapsone 5 % topical gel, , Disp: , Rfl:  .  diphenhydrAMINE (BENADRYL ALLERGY) 25 mg capsule, , Disp: , Rfl:  .  drospirenone-ethinyl estradiol (YAZ) 3-0.02 MG tablet, Take 1 tablet by mouth daily., Disp: , Rfl:  .  escitalopram (LEXAPRO) 20 MG tablet,  Take 20 mg by mouth daily., Disp: , Rfl:  .  fluticasone (FLONASE) 50 MCG/ACT nasal spray, Place 1 spray into both nostrils daily., Disp: 16 g, Rfl: 0 .  hydrOXYzine (VISTARIL) 25 MG capsule, Take 25 mg by mouth 2 (two) times daily as needed., Disp: , Rfl:  .  ibuprofen (ADVIL,MOTRIN) 200 MG tablet, Take 200 mg by mouth as needed. Reported on 01/03/2016, Disp: , Rfl:  .  levocetirizine (XYZAL) 5 MG tablet, Take 5 mg by mouth every evening., Disp: , Rfl:  .  Omega-3 Fatty Acids (FISH OIL PO), Take by mouth daily., Disp: , Rfl:  .  PREVIDENT 5000 BOOSTER PLUS 1.1 % PSTE, BRUSH WITH PEA-SIZED AMT BID AND SPIT. DO NOT EAT OR DRINK FOR 30 MIN AFTER, Disp: , Rfl:  .  tretinoin (RETIN-A) 0.05 % cream, , Disp: , Rfl:   EXAM:  VITALS per patient if applicable: RR between 12-20 bpm  GENERAL: alert, oriented, appears well and in no acute distress  HEENT: atraumatic, conjunctiva clear, no obvious abnormalities on inspection of external nose and ears  NECK: normal movements of the head and neck  LUNGS: on inspection no signs of respiratory distress, breathing rate appears normal, no obvious gross SOB, gasping or wheezing  CV: no obvious cyanosis  MS: moves all visible extremities without noticeable abnormality  PSYCH/NEURO: pleasant and cooperative, no obvious depression or anxiety, speech and thought processing grossly intact  ASSESSMENT AND PLAN:  Discussed the following assessment and plan:  Viral URI with cough -Discussed likely causes including acute nasopharyngitis, influenza, cannot rule out COVID-19 -Continue supportive care -Discussed gargling with warm salt water or Chloraseptic spray, using Flonase-patient has some at home -Discussed Covid testing.  self quarantine while awaiting results -Given precautions for worsening symptoms  Educated about COVID-19 virus infection -Discussed s/s of COVID-19 infection -pt s/p second COVID-19 vaccine on 4/3 -Advised to consider COVID testing  given overlapping symptoms with common cold and allergies. -Given info on area testing sites  Follow-up as needed  I discussed the assessment and treatment plan with the patient. The patient was provided an opportunity to ask questions and all were answered. The patient agreed with the plan and demonstrated an understanding of the instructions.   The patient was advised to call back or seek an in-person evaluation if the symptoms worsen or if the condition fails to improve as anticipated.  Billie Ruddy, MD

## 2019-10-26 DIAGNOSIS — F419 Anxiety disorder, unspecified: Secondary | ICD-10-CM | POA: Diagnosis not present

## 2019-10-26 DIAGNOSIS — Z20822 Contact with and (suspected) exposure to covid-19: Secondary | ICD-10-CM | POA: Diagnosis not present

## 2019-10-26 DIAGNOSIS — R05 Cough: Secondary | ICD-10-CM | POA: Diagnosis not present

## 2019-10-26 DIAGNOSIS — R070 Pain in throat: Secondary | ICD-10-CM | POA: Diagnosis not present

## 2019-10-26 DIAGNOSIS — R5383 Other fatigue: Secondary | ICD-10-CM | POA: Diagnosis not present

## 2019-10-27 ENCOUNTER — Encounter: Payer: Self-pay | Admitting: Family Medicine

## 2019-10-29 ENCOUNTER — Other Ambulatory Visit: Payer: Self-pay | Admitting: Family Medicine

## 2019-10-29 DIAGNOSIS — R0981 Nasal congestion: Secondary | ICD-10-CM

## 2019-10-31 ENCOUNTER — Telehealth: Payer: Self-pay | Admitting: Family Medicine

## 2019-10-31 NOTE — Telephone Encounter (Signed)
Left a message for the pt to return my call.  

## 2019-10-31 NOTE — Telephone Encounter (Signed)
Spoke with the pt and offered a virtual visit with another provider today or tomorrow with PCP.  Appt scheduled for a virtual visit for tomorrow with Dr Hassan Rowan per pts preference.

## 2019-10-31 NOTE — Telephone Encounter (Signed)
See My chart message

## 2019-10-31 NOTE — Telephone Encounter (Signed)
Pt return your call and will wait for a call back.

## 2019-11-01 ENCOUNTER — Telehealth (INDEPENDENT_AMBULATORY_CARE_PROVIDER_SITE_OTHER): Payer: BC Managed Care – PPO | Admitting: Family Medicine

## 2019-11-01 ENCOUNTER — Encounter: Payer: Self-pay | Admitting: Family Medicine

## 2019-11-01 VITALS — Temp 98.1°F | Ht 66.0 in | Wt 170.0 lb

## 2019-11-01 DIAGNOSIS — J019 Acute sinusitis, unspecified: Secondary | ICD-10-CM | POA: Diagnosis not present

## 2019-11-01 MED ORDER — AMOXICILLIN 500 MG PO CAPS: 1000.0000 mg | ORAL_CAPSULE | Freq: Two times a day (BID) | ORAL | 0 refills | Status: DC

## 2019-11-01 NOTE — Progress Notes (Signed)
Virtual Visit via Video Note  I connected with Melissa Daniel on 11/01/19 at  8:30 AM EDT by a video enabled telemedicine application and verified that I am speaking with the correct person using two identifiers.  Location patient: home Location provider:work or home office Persons participating in the virtual visit: patient, provider  I discussed the limitations of evaluation and management by telemedicine and the availability of in person appointments. The patient expressed understanding and agreed to proceed.   Melissa Daniel DOB: December 18, 2000 Encounter date: 11/01/2019  This is a 19 y.o. female who presents with Chief Complaint  Patient presents with  . Cough    Pt stated she has been having sx for 1.5 weeks. Pt denies fever headache and ear pain.   . Nasal Congestion  . scratchy throat    History of present illness:  Cough for last 1.5 weeks. Sinus headache, sinus pressure - frontal/maxillary. Day after her physical she started coughing and feeling sick. Has been having to blow nose a lot; does have cough. No wheezing or SOB. No fevers.   Hasn't had issues with sinuses historically. Has taken mucinex, dayquil, nyquil.   Not having urge to cough as much this morning. Not sore throat; more scratchy.      No Known Allergies Current Meds  Medication Sig  . Dapsone 5 % topical gel   . diphenhydrAMINE (BENADRYL ALLERGY) 25 mg capsule   . drospirenone-ethinyl estradiol (YAZ) 3-0.02 MG tablet Take 1 tablet by mouth daily.  Marland Kitchen escitalopram (LEXAPRO) 20 MG tablet Take 20 mg by mouth daily.  . fluticasone (FLONASE) 50 MCG/ACT nasal spray SHAKE LIQUID AND USE 1 SPRAY IN EACH NOSTRIL DAILY  . hydrOXYzine (VISTARIL) 25 MG capsule Take 25 mg by mouth 2 (two) times daily as needed.  Marland Kitchen ibuprofen (ADVIL,MOTRIN) 200 MG tablet Take 200 mg by mouth as needed. Reported on 01/03/2016  . levocetirizine (XYZAL) 5 MG tablet Take 5 mg by mouth every evening.  . Omega-3 Fatty Acids (FISH OIL PO)  Take by mouth daily.  Marland Kitchen PREVIDENT 5000 BOOSTER PLUS 1.1 % PSTE BRUSH WITH PEA-SIZED AMT BID AND SPIT. DO NOT EAT OR DRINK FOR 30 MIN AFTER  . tretinoin (RETIN-A) 0.05 % cream     Review of Systems  Constitutional: Negative for chills and fever.  HENT: Positive for congestion, postnasal drip, sinus pressure and sinus pain. Negative for ear pain, sore throat and trouble swallowing.   Respiratory: Positive for cough. Negative for shortness of breath and wheezing.   Cardiovascular: Negative for chest pain.    Objective:  Temp 98.1 F (36.7 C) (Other (Comment))   Ht 5\' 6"  (1.676 m)   Wt 170 lb (77.1 kg)   LMP 10/16/2019 (Exact Date)   BMI 27.44 kg/m   Weight: 170 lb (77.1 kg)   BP Readings from Last 3 Encounters:  10/23/19 110/62  05/16/19 118/74  02/08/19 101/68   Wt Readings from Last 3 Encounters:  11/01/19 170 lb (77.1 kg) (92 %, Z= 1.38)*  10/25/19 172 lb (78 kg) (92 %, Z= 1.43)*  10/23/19 172 lb 12.8 oz (78.4 kg) (93 %, Z= 1.44)*   * Growth percentiles are based on CDC (Girls, 2-20 Years) data.    EXAM:  GENERAL: alert, oriented, appears well and in no acute distress  HEENT: atraumatic, conjunctiva clear, no obvious abnormalities on inspection of external nose and ears  NECK: normal movements of the head and neck  LUNGS: on inspection no signs of respiratory distress,  breathing rate appears normal, no obvious gross SOB, gasping or wheezing  CV: no obvious cyanosis  MS: moves all visible extremities without noticeable abnormality  PSYCH/NEURO: pleasant and cooperative, no obvious depression or anxiety, speech and thought processing grossly intact   Assessment/Plan  1. Acute non-recurrent sinusitis, unspecified location Symptoms do seem to be improving today for her.  Since this provider will be out for the rest of the week, I did go ahead and send in a prescription for to the pharmacy in case of any worsening of symptoms, we discussed the typical timeline for  sinusitis will be a couple of weeks prior to any bacterial component for the infection.  Continue with allergy medications, continue with Mucinex as well as DayQuil/NyQuil as needed.  If she does end up starting antibiotic (for worsening sinus pain or fever) and does not note improvement within 48 hours, I have instructed her to let us know.  If she develops any more significant respiratory symptoms I advised her to let us know and consider Covid evaluation.  She is aware that we have respiratory clinic she can be seen at if needed.  I discussed the assessment and treatment plan with the patient. The patient was provided an opportunity to ask questions and all were answered. The patient agreed with the plan and demonstrated an understanding of the instructions.   The patient was advised to call back or seek an in-person evaluation if the symptoms worsen or if the condition fails to improve as anticipated.  I provided 15 minutes of non-face-to-face time during this encounter.   Micheline Rough, MD

## 2019-11-09 DIAGNOSIS — F419 Anxiety disorder, unspecified: Secondary | ICD-10-CM | POA: Diagnosis not present

## 2019-11-30 DIAGNOSIS — Z309 Encounter for contraceptive management, unspecified: Secondary | ICD-10-CM | POA: Diagnosis not present

## 2019-11-30 DIAGNOSIS — Z6827 Body mass index (BMI) 27.0-27.9, adult: Secondary | ICD-10-CM | POA: Diagnosis not present

## 2019-11-30 DIAGNOSIS — Z01419 Encounter for gynecological examination (general) (routine) without abnormal findings: Secondary | ICD-10-CM | POA: Diagnosis not present

## 2019-11-30 DIAGNOSIS — N946 Dysmenorrhea, unspecified: Secondary | ICD-10-CM | POA: Diagnosis not present

## 2019-11-30 DIAGNOSIS — Z113 Encounter for screening for infections with a predominantly sexual mode of transmission: Secondary | ICD-10-CM | POA: Diagnosis not present

## 2019-12-14 DIAGNOSIS — F419 Anxiety disorder, unspecified: Secondary | ICD-10-CM | POA: Diagnosis not present

## 2020-01-29 DIAGNOSIS — F419 Anxiety disorder, unspecified: Secondary | ICD-10-CM | POA: Diagnosis not present

## 2020-03-28 DIAGNOSIS — M9902 Segmental and somatic dysfunction of thoracic region: Secondary | ICD-10-CM | POA: Diagnosis not present

## 2020-03-28 DIAGNOSIS — M9903 Segmental and somatic dysfunction of lumbar region: Secondary | ICD-10-CM | POA: Diagnosis not present

## 2020-03-28 DIAGNOSIS — M9901 Segmental and somatic dysfunction of cervical region: Secondary | ICD-10-CM | POA: Diagnosis not present

## 2020-03-28 DIAGNOSIS — M531 Cervicobrachial syndrome: Secondary | ICD-10-CM | POA: Diagnosis not present

## 2020-04-24 ENCOUNTER — Ambulatory Visit: Payer: BC Managed Care – PPO | Admitting: Family Medicine

## 2020-04-27 ENCOUNTER — Encounter (HOSPITAL_COMMUNITY): Payer: Self-pay

## 2020-04-27 ENCOUNTER — Ambulatory Visit (HOSPITAL_COMMUNITY)
Admission: EM | Admit: 2020-04-27 | Discharge: 2020-04-27 | Disposition: A | Discharge status: Home / Self Care | Payer: BC Managed Care – PPO

## 2020-04-27 ENCOUNTER — Other Ambulatory Visit: Payer: Self-pay

## 2020-04-27 DIAGNOSIS — S60552A Superficial foreign body of left hand, initial encounter: Secondary | ICD-10-CM

## 2020-04-27 DIAGNOSIS — M79645 Pain in left finger(s): Secondary | ICD-10-CM | POA: Diagnosis not present

## 2020-04-27 MED ORDER — LIDOCAINE HCL 2 % IJ SOLN
INTRAMUSCULAR | Status: AC
  Filled 2020-04-27: qty 20

## 2020-04-27 NOTE — ED Triage Notes (Signed)
Pt presents with ring stuck on left ring finger since yesterday; pt has significant swelling and redness.

## 2020-04-27 NOTE — ED Provider Notes (Signed)
[facility]   MRN: [id number] DOB: 23-Mar-2001  Subjective:   Melissa Daniel is a 19 y.o. female presenting for acute onset of left ring finger swelling and pain from a ring.  Denies loss of sensation or discoloration of her finger.  No current facility-administered medications for this encounter.  Current Outpatient Medications:  .  amoxicillin (AMOXIL) 500 MG capsule, Take 2 capsules (1,000 mg total) by mouth 2 (two) times daily., Disp: 20 capsule, Rfl: 0 .  Dapsone 5 % topical gel, , Disp: , Rfl:  .  diphenhydrAMINE (BENADRYL ALLERGY) 25 mg capsule, , Disp: , Rfl:  .  drospirenone-ethinyl estradiol (YAZ) 3-0.02 MG tablet, Take 1 tablet by mouth daily., Disp: , Rfl:  .  escitalopram (LEXAPRO) 20 MG tablet, Take 20 mg by mouth daily., Disp: , Rfl:  .  fluticasone (FLONASE) 50 MCG/ACT nasal spray, SHAKE LIQUID AND USE 1 SPRAY IN EACH NOSTRIL DAILY, Disp: 16 g, Rfl: 1 .  hydrOXYzine (VISTARIL) 25 MG capsule, Take 25 mg by mouth 2 (two) times daily as needed., Disp: , Rfl:  .  ibuprofen (ADVIL,MOTRIN) 200 MG tablet, Take 200 mg by mouth as needed. Reported on 01/03/2016, Disp: , Rfl:  .  levocetirizine (XYZAL) 5 MG tablet, Take 5 mg by mouth every evening., Disp: , Rfl:  .  Omega-3 Fatty Acids (FISH OIL PO), Take by mouth daily., Disp: , Rfl:  .  PREVIDENT 5000 BOOSTER PLUS 1.1 % PSTE, BRUSH WITH PEA-SIZED AMT BID AND SPIT. DO NOT EAT OR DRINK FOR 30 MIN AFTER, Disp: , Rfl:  .  tretinoin (RETIN-A) 0.05 % cream, , Disp: , Rfl:    No Known Allergies  Past Medical History:  Diagnosis Date  . Anxiety   . Central auditory processing disorder    audiological, OT, full psych eval in 2008-2009  . Depression   . Idiopathic scoliosis    mild  . Problems with learning    CAP, has tutor     Past Surgical History:  Procedure Laterality Date  . ADENOIDECTOMY    . TYMPANOSTOMY TUBE PLACEMENT  2003  . WISDOM TOOTH EXTRACTION      Family History  Problem Relation Age  of Onset  . Hypertension Father   . Liver disease Father   . Seizures Father   . Ankylosing spondylitis Father   . Lupus Paternal Grandmother   . Alzheimer's disease Maternal Grandmother     Social History   Tobacco Use  . Smoking status: Never Smoker  . Smokeless tobacco: Never Used  Vaping Use  . Vaping Use: Former  Substance Use Topics  . Alcohol use: Yes    Comment: rare 1 drink  . Drug use: No    ROS   Objective:   Vitals: BP 132/71 (BP Location: Right Arm)   Pulse 87   Temp 97.8 F (36.6 C) (Oral)   Resp 17   LMP 04/01/2020   SpO2 99%   Physical Exam Constitutional:      General: She is not in acute distress.    Appearance: Normal appearance. She is well-developed. She is not ill-appearing, toxic-appearing or diaphoretic.  HENT:     Head: Normocephalic and atraumatic.     Nose: Nose normal.     Mouth/Throat:     Mouth: Mucous membranes are moist.     Pharynx: Oropharynx is clear.  Eyes:     General: No scleral icterus.    Extraocular Movements: Extraocular movements intact.  Pupils: Pupils are equal, round, and reactive to light.  Cardiovascular:     Rate and Rhythm: Normal rate.  Pulmonary:     Effort: Pulmonary effort is normal.  Musculoskeletal:       Hands:  Skin:    General: Skin is warm and dry.  Neurological:     General: No focal deficit present.     Mental Status: She is alert and oriented to person, place, and time.  Psychiatric:        Mood and Affect: Mood normal.        Behavior: Behavior normal.        Thought Content: Thought content normal.        Judgment: Judgment normal.     Local anesthesia applied using 2% without epinephrine to the palmar aspect of her left hand.  An electronic ring cutter was used placing lubrication at the ring.  Hemostats were used on either side to pry the ring apart.  Ring removed without incident.  Cleansed with iodine, soap and water.  Assessment and Plan :   PDMP not reviewed this  encounter.  1. Finger pain, left   2. Foreign body of left hand, initial encounter     Ring removed successfully.  Recommended supportive care, Tylenol and/or ibuprofen for pain relief Counseled patient on potential for adverse effects with medications prescribed/recommended today, ER and return-to-clinic precautions discussed, patient verbalized understanding.    Wallis Bamberg, PA-C 04/27/20 1330

## 2020-06-03 ENCOUNTER — Ambulatory Visit: Payer: BC Managed Care – PPO | Admitting: Family Medicine

## 2020-06-10 ENCOUNTER — Encounter: Payer: Self-pay | Admitting: Family Medicine

## 2020-06-10 ENCOUNTER — Ambulatory Visit: Payer: BC Managed Care – PPO | Admitting: Family Medicine

## 2020-06-10 ENCOUNTER — Other Ambulatory Visit: Payer: Self-pay

## 2020-06-10 VITALS — BP 118/72 | HR 103 | Temp 98.1°F | Ht 66.0 in | Wt 192.6 lb

## 2020-06-10 DIAGNOSIS — R635 Abnormal weight gain: Secondary | ICD-10-CM

## 2020-06-10 DIAGNOSIS — R5383 Other fatigue: Secondary | ICD-10-CM | POA: Diagnosis not present

## 2020-06-10 DIAGNOSIS — F419 Anxiety disorder, unspecified: Secondary | ICD-10-CM

## 2020-06-10 NOTE — Progress Notes (Signed)
Melissa Daniel DOB: 04/21/2001 Encounter date: 06/10/2020  This is a 19 y.o. female who presents with Chief Complaint  Patient presents with  . Follow-up    History of present illness: Last physical was 11/19/2019.  She is worried about weight gain - has recently decreased the lexapro dose (December 7th) due to sleeping too much. Hasn't been as motivated to be active - used to walk every day. Mood has been pretty good. Sometimes energy is kind of down. Does snore, but doesn't think disrupting sleep. Not rested in morning. Does have issues with sleep.   Attending [employer]. School has been going well. Had good grades. Thinks she is going to do communications major.   Switched from tri-sprintec to the yaz which seemed to help more with mood. Does feel like it has helped. Still gets bad period cramps.   No concerns with skin; using retin A.    No Known Allergies Current Meds  Medication Sig  . Dapsone 5 % topical gel   . diphenhydrAMINE (BENADRYL) 25 mg capsule   . drospirenone-ethinyl estradiol (YAZ) 3-0.02 MG tablet Take 1 tablet by mouth daily.  Marland Kitchen escitalopram (LEXAPRO) 10 MG tablet Take 15 mg by mouth daily.  . fluticasone (FLONASE) 50 MCG/ACT nasal spray SHAKE LIQUID AND USE 1 SPRAY IN EACH NOSTRIL DAILY  . hydrOXYzine (VISTARIL) 25 MG capsule Take 25 mg by mouth 2 (two) times daily as needed.  Marland Kitchen ibuprofen (ADVIL,MOTRIN) 200 MG tablet Take 200 mg by mouth as needed. Reported on 01/03/2016  . levocetirizine (XYZAL) 5 MG tablet Take 5 mg by mouth every evening.  . Omega-3 Fatty Acids (FISH OIL PO) Take by mouth daily.  Marland Kitchen PREVIDENT 5000 BOOSTER PLUS 1.1 % PSTE BRUSH WITH PEA-SIZED AMT BID AND SPIT. DO NOT EAT OR DRINK FOR 30 MIN AFTER  . tretinoin (RETIN-A) 0.05 % cream     Review of Systems  Constitutional: Positive for unexpected weight change. Negative for chills, fatigue and fever.  Respiratory: Negative for cough, chest tightness, shortness of breath and wheezing.    Cardiovascular: Negative for chest pain, palpitations and leg swelling.    Objective:  BP 118/72 (BP Location: Left Arm, Patient Position: Sitting, Cuff Size: Large)   Pulse (!) 103   Temp 98.1 F (36.7 C) (Oral)   Ht 5\' 6"  (1.676 m)   Wt 192 lb 9.6 oz (87.4 kg)   LMP 05/25/2020 (Exact Date)   BMI 31.09 kg/m   Weight: 192 lb 9.6 oz (87.4 kg)   BP Readings from Last 3 Encounters:  06/10/20 118/72  04/27/20 132/71  10/23/19 110/62   Wt Readings from Last 3 Encounters:  06/10/20 192 lb 9.6 oz (87.4 kg)  11/01/19 170 lb (77.1 kg) (92 %, Z= 1.38)*  10/25/19 172 lb (78 kg) (92 %, Z= 1.43)*   * Growth percentiles are based on CDC (Girls, 2-20 Years) data.    Physical Exam Constitutional:      General: She is not in acute distress.    Appearance: She is well-developed.  Cardiovascular:     Rate and Rhythm: Normal rate and regular rhythm.     Heart sounds: Normal heart sounds. No murmur heard. No friction rub.  Pulmonary:     Effort: Pulmonary effort is normal. No respiratory distress.     Breath sounds: Normal breath sounds. No wheezing or rales.  Musculoskeletal:     Right lower leg: No edema.     Left lower leg: No edema.  Neurological:  Mental Status: She is alert and oriented to person, place, and time.  Psychiatric:        Behavior: Behavior normal.     Assessment/Plan  1. Anxiety Anxiety has been stable.  Recently Lexapro dose was decreased.  She does follow with psychiatry.  2. Weight gain She has gained over 20 pounds since her last visit.  May be related to Lexapro and/or YAZ.  Additionally, we discussed that she has stopped being as active.  I encouraged her to work on daily exercise, even if just for 10 minutes.  We discussed the benefits of exercise include help with energy level, improved sleep, improved mood-all of which she would benefit from.  I have asked for her to work on this daily as well as limiting portions (we discussed single plate of food  and not getting seconds) and update me in 1 month's time.  She does not seem to have excessively unhealthy eating habits like binging, but we can continue to discuss more in the future.  I am hopeful as well the decrease in Lexapro dose may help with weight gain if it is indeed related to this medication.  She has had a difficult time with hormonal birth control in the past and uses this to control acne as well, so she does worry about a change in birth control affecting her skin, but if she is sensitive to weight gain from that medication, it may be best to consider alternative options.  She does follow with gynecology for management.  3. Fatigue, unspecified type Start with blood work, but going to work on regular exercise as well.  Discussed avoiding napping during the day to help with better sleep at night.- CBC with Differential/Platelet; Future - Comprehensive metabolic panel; Future - TSH; Future - T4, free; Future - Vitamin B12; Future - VITAMIN D 25 Hydroxy (Vit-D Deficiency, Fractures); Future - Iron, TIBC and Ferritin Panel; Future    Return in about 3 months (around 09/08/2020) for Chronic condition visit.     Theodis Shove, MD

## 2020-06-11 DIAGNOSIS — J3089 Other allergic rhinitis: Secondary | ICD-10-CM | POA: Diagnosis not present

## 2020-06-11 DIAGNOSIS — H1045 Other chronic allergic conjunctivitis: Secondary | ICD-10-CM | POA: Diagnosis not present

## 2020-06-11 LAB — CBC WITH DIFFERENTIAL/PLATELET
Absolute Monocytes: 537 cells/uL (ref 200–950)
Basophils Absolute: 79 cells/uL (ref 0–200)
Basophils Relative: 0.9 %
Eosinophils Absolute: 123 cells/uL (ref 15–500)
Eosinophils Relative: 1.4 %
HCT: 38.9 % (ref 35.0–45.0)
Hemoglobin: 13.2 g/dL (ref 11.7–15.5)
Lymphs Abs: 2517 cells/uL (ref 850–3900)
MCH: 28.6 pg (ref 27.0–33.0)
MCHC: 33.9 g/dL (ref 32.0–36.0)
MCV: 84.4 fL (ref 80.0–100.0)
MPV: 10.2 fL (ref 7.5–12.5)
Monocytes Relative: 6.1 %
Neutro Abs: 5544 cells/uL (ref 1500–7800)
Neutrophils Relative %: 63 %
Platelets: 295 10*3/uL (ref 140–400)
RBC: 4.61 10*6/uL (ref 3.80–5.10)
RDW: 12.2 % (ref 11.0–15.0)
Total Lymphocyte: 28.6 %
WBC: 8.8 10*3/uL (ref 3.8–10.8)

## 2020-06-11 LAB — VITAMIN D 25 HYDROXY (VIT D DEFICIENCY, FRACTURES): Vit D, 25-Hydroxy: 26 ng/mL — ABNORMAL LOW (ref 30–100)

## 2020-06-11 LAB — COMPREHENSIVE METABOLIC PANEL
AG Ratio: 1.4 (calc) (ref 1.0–2.5)
ALT: 9 U/L (ref 6–29)
AST: 14 U/L (ref 10–30)
Albumin: 4.1 g/dL (ref 3.6–5.1)
Alkaline phosphatase (APISO): 67 U/L (ref 31–125)
BUN: 13 mg/dL (ref 7–25)
CO2: 24 mmol/L (ref 20–32)
Calcium: 9.4 mg/dL (ref 8.6–10.2)
Chloride: 103 mmol/L (ref 98–110)
Creat: 0.72 mg/dL (ref 0.50–1.10)
Globulin: 2.9 g/dL (calc) (ref 1.9–3.7)
Glucose, Bld: 79 mg/dL (ref 65–99)
Potassium: 4.5 mmol/L (ref 3.5–5.3)
Sodium: 138 mmol/L (ref 135–146)
Total Bilirubin: 0.3 mg/dL (ref 0.2–1.2)
Total Protein: 7 g/dL (ref 6.1–8.1)

## 2020-06-11 LAB — IRON,TIBC AND FERRITIN PANEL
%SAT: 23 % (calc) (ref 16–45)
Ferritin: 43 ng/mL (ref 16–154)
Iron: 99 ug/dL (ref 40–190)
TIBC: 429 mcg/dL (calc) (ref 250–450)

## 2020-06-11 LAB — VITAMIN B12: Vitamin B-12: 300 pg/mL (ref 200–1100)

## 2020-06-11 LAB — T4, FREE: Free T4: 1.1 ng/dL (ref 0.8–1.4)

## 2020-06-11 LAB — TSH: TSH: 0.95 mIU/L

## 2020-07-09 DIAGNOSIS — F419 Anxiety disorder, unspecified: Secondary | ICD-10-CM | POA: Diagnosis not present

## 2020-07-16 ENCOUNTER — Encounter: Payer: Self-pay | Admitting: Family Medicine

## 2020-07-16 DIAGNOSIS — K599 Functional intestinal disorder, unspecified: Secondary | ICD-10-CM

## 2020-07-23 DIAGNOSIS — F419 Anxiety disorder, unspecified: Secondary | ICD-10-CM | POA: Diagnosis not present

## 2020-08-02 DIAGNOSIS — M9901 Segmental and somatic dysfunction of cervical region: Secondary | ICD-10-CM | POA: Diagnosis not present

## 2020-08-02 DIAGNOSIS — M9903 Segmental and somatic dysfunction of lumbar region: Secondary | ICD-10-CM | POA: Diagnosis not present

## 2020-08-02 DIAGNOSIS — M531 Cervicobrachial syndrome: Secondary | ICD-10-CM | POA: Diagnosis not present

## 2020-08-02 DIAGNOSIS — M9902 Segmental and somatic dysfunction of thoracic region: Secondary | ICD-10-CM | POA: Diagnosis not present

## 2020-08-06 DIAGNOSIS — F419 Anxiety disorder, unspecified: Secondary | ICD-10-CM | POA: Diagnosis not present

## 2020-08-16 DIAGNOSIS — M531 Cervicobrachial syndrome: Secondary | ICD-10-CM | POA: Diagnosis not present

## 2020-08-16 DIAGNOSIS — M9902 Segmental and somatic dysfunction of thoracic region: Secondary | ICD-10-CM | POA: Diagnosis not present

## 2020-08-16 DIAGNOSIS — M9903 Segmental and somatic dysfunction of lumbar region: Secondary | ICD-10-CM | POA: Diagnosis not present

## 2020-08-16 DIAGNOSIS — M9901 Segmental and somatic dysfunction of cervical region: Secondary | ICD-10-CM | POA: Diagnosis not present

## 2020-08-20 DIAGNOSIS — F419 Anxiety disorder, unspecified: Secondary | ICD-10-CM | POA: Diagnosis not present

## 2020-08-23 ENCOUNTER — Encounter: Payer: Self-pay | Admitting: Gastroenterology

## 2020-09-17 DIAGNOSIS — F419 Anxiety disorder, unspecified: Secondary | ICD-10-CM | POA: Diagnosis not present

## 2020-10-01 DIAGNOSIS — F419 Anxiety disorder, unspecified: Secondary | ICD-10-CM | POA: Diagnosis not present

## 2020-10-15 DIAGNOSIS — F419 Anxiety disorder, unspecified: Secondary | ICD-10-CM | POA: Diagnosis not present

## 2020-10-28 DIAGNOSIS — M9903 Segmental and somatic dysfunction of lumbar region: Secondary | ICD-10-CM | POA: Diagnosis not present

## 2020-10-28 DIAGNOSIS — M9901 Segmental and somatic dysfunction of cervical region: Secondary | ICD-10-CM | POA: Diagnosis not present

## 2020-10-28 DIAGNOSIS — M9902 Segmental and somatic dysfunction of thoracic region: Secondary | ICD-10-CM | POA: Diagnosis not present

## 2020-10-28 DIAGNOSIS — M531 Cervicobrachial syndrome: Secondary | ICD-10-CM | POA: Diagnosis not present

## 2020-10-29 DIAGNOSIS — S93602A Unspecified sprain of left foot, initial encounter: Secondary | ICD-10-CM | POA: Diagnosis not present

## 2020-11-06 ENCOUNTER — Encounter: Payer: Self-pay | Admitting: Gastroenterology

## 2020-11-06 ENCOUNTER — Other Ambulatory Visit (INDEPENDENT_AMBULATORY_CARE_PROVIDER_SITE_OTHER): Payer: BC Managed Care – PPO

## 2020-11-06 ENCOUNTER — Other Ambulatory Visit: Payer: Self-pay

## 2020-11-06 ENCOUNTER — Ambulatory Visit (INDEPENDENT_AMBULATORY_CARE_PROVIDER_SITE_OTHER): Payer: BC Managed Care – PPO | Admitting: Gastroenterology

## 2020-11-06 VITALS — BP 110/62 | HR 95 | Ht 66.0 in | Wt 195.5 lb

## 2020-11-06 DIAGNOSIS — R12 Heartburn: Secondary | ICD-10-CM

## 2020-11-06 DIAGNOSIS — R1030 Lower abdominal pain, unspecified: Secondary | ICD-10-CM

## 2020-11-06 DIAGNOSIS — R140 Abdominal distension (gaseous): Secondary | ICD-10-CM | POA: Insufficient documentation

## 2020-11-06 DIAGNOSIS — R109 Unspecified abdominal pain: Secondary | ICD-10-CM

## 2020-11-06 DIAGNOSIS — K5900 Constipation, unspecified: Secondary | ICD-10-CM

## 2020-11-06 DIAGNOSIS — R110 Nausea: Secondary | ICD-10-CM

## 2020-11-06 LAB — CBC
HCT: 39.1 % (ref 36.0–46.0)
Hemoglobin: 13.7 g/dL (ref 12.0–15.0)
MCHC: 35 g/dL (ref 30.0–36.0)
MCV: 82.9 fl (ref 78.0–100.0)
Platelets: 282 10*3/uL (ref 150.0–400.0)
RBC: 4.72 Mil/uL (ref 3.87–5.11)
RDW: 12.4 % (ref 11.5–14.6)
WBC: 7.9 10*3/uL (ref 4.5–10.5)

## 2020-11-06 LAB — COMPREHENSIVE METABOLIC PANEL
ALT: 11 U/L (ref 0–35)
AST: 14 U/L (ref 0–37)
Albumin: 4.1 g/dL (ref 3.5–5.2)
Alkaline Phosphatase: 71 U/L (ref 39–117)
BUN: 12 mg/dL (ref 6–23)
CO2: 24 mEq/L (ref 19–32)
Calcium: 9.6 mg/dL (ref 8.4–10.5)
Chloride: 104 mEq/L (ref 96–112)
Creatinine, Ser: 0.94 mg/dL (ref 0.40–1.20)
GFR: 87.14 mL/min (ref >60.00)
Glucose, Bld: 116 mg/dL — ABNORMAL HIGH (ref 70–99)
Potassium: 4.2 mEq/L (ref 3.5–5.1)
Sodium: 138 mEq/L (ref 135–145)
Total Bilirubin: 0.3 mg/dL (ref 0.2–1.2)
Total Protein: 7.1 g/dL (ref 6.0–8.3)

## 2020-11-06 LAB — TSH: TSH: 1.07 u[IU]/mL (ref 0.35–5.50)

## 2020-11-06 MED ORDER — ESOMEPRAZOLE MAGNESIUM 40 MG PO CPDR: 40.0000 mg | DELAYED_RELEASE_CAPSULE | Freq: Every day | ORAL | 2 refills | Status: DC

## 2020-11-06 NOTE — Progress Notes (Signed)
[facility]   Primary Care Provider Caren Macadam, MD [place] [address] [phone]  Referring Provider Caren Macadam, MD [address] [place],  [place] [address] [phone]  Patient Profile: Melissa Daniel is a 20 y.o. female with a pmh significant for obesity, anxiety/MDD, central auditory processing disorder.  The patient presents to the [facility] for an evaluation and management of problem(s) noted below:  Problem List 1. Pyrosis   2. Nausea without vomiting   3. Lower abdominal pain   4. Bloating   5. Abdominal cramping   6. Constipation, unspecified constipation type     History of Present Illness This is the first visit to the outpatient [facility].  She has experienced issues of abdominal discomfort acid reflux and pain and nausea over many years however symptoms were not as frequent as they have become over the course of the last 6 months.  The patient is now experiencing abdominal pain on a regular basis greater than 80% of the week.  She will have acid reflux/pyrosis symptoms 3-4 times per week.  She has tried Pepcid in the past which has been helpful when she has used it but she does not use it on a regular basis.  The visible did not help her.  She has noticed increased issues of eructation and deals with daily bloating and abdominal gas.  She experiences nausea 3-4 times per week and Pepto-Bismol has not been helpful for that.  She uses Vistaril for other issues but that has been helpful for nausea.  She does not have a true antiemetic.  Her abdominal discomfort is a lower discomfort that is cramping at times.  It can occur at any time point during the day.  She has experienced issues of constipation in the past but normally has between 1 and 2 bowel movements daily.  She sometimes has a sensation of incomplete evacuation but for the most part is not having too much  of an issue.  She has gained almost 50 pounds over 2 years.  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  Her father has a history of underlying liver disease which she believes was attributed to fatty liver.  She does drink between 1 and 2 alcoholic beverages a week.  The patient does not take significant nonsteroidals or BC/Goody powders.  She has never had an upper or lower endoscopy.  She does feel that her symptoms are worse around the time of her menstruation.  GI Review of Systems Positive as above Negative for dysphagia, odynophagia, hematemesis, coffee-ground emesis, melena, hematochezia  Review of Systems General: Denies fevers/chills/weight loss unintentionally HEENT: Denies oral lesions Cardiovascular: Denies chest pain/palpitations Pulmonary: Denies shortness of breath/nocturnal cough Gastroenterological: See HPI Genitourinary: Denies darkened urine or hematuria Hematological: Denies easy bruising/bleeding Endocrine: Denies temperature intolerance Dermatological: Denies jaundice Psychological: Mood is stable as she is out of school this summer and on break and back at home   Medications Current Outpatient Medications  Medication Sig Dispense Refill  . azelastine (ASTELIN) 0.1 % nasal spray Place 1-2 sprays into both nostrils 2 (two) times daily.    Marland Kitchen buPROPion (WELLBUTRIN XL) 150 MG 24 hr tablet Take 150 mg by mouth daily.    Marland Kitchen esomeprazole (NEXIUM) 40 MG capsule Take 1 capsule (40 mg total) by mouth daily before breakfast. 30 capsule 2  . Dapsone 5 %  topical gel     . diphenhydrAMINE (BENADRYL) 25 mg capsule     . drospirenone-ethinyl estradiol (YAZ) 3-0.02 MG tablet Take 1 tablet by mouth daily.    Marland Kitchen escitalopram (LEXAPRO) 10 MG tablet Take 5 mg by mouth daily.    . fluticasone (FLONASE) 50 MCG/ACT nasal spray SHAKE LIQUID AND USE 1 SPRAY IN EACH NOSTRIL DAILY 16 g 1  . hydrOXYzine  (VISTARIL) 25 MG capsule Take 25 mg by mouth 2 (two) times daily as needed.    Marland Kitchen ibuprofen (ADVIL,MOTRIN) 200 MG tablet Take 200 mg by mouth as needed. Reported on 01/03/2016    . levocetirizine (XYZAL) 5 MG tablet Take 5 mg by mouth every evening.    . Omega-3 Fatty Acids (FISH OIL PO) Take by mouth daily.    Marland Kitchen PREVIDENT 5000 BOOSTER PLUS 1.1 % PSTE BRUSH WITH PEA-SIZED AMT BID AND SPIT. DO NOT EAT OR DRINK FOR 30 MIN AFTER    . tretinoin (RETIN-A) 0.05 % cream      No current facility-administered medications for this visit.    Allergies No Known Allergies  Histories Past Medical History:  Diagnosis Date  . Anxiety   . Central auditory processing disorder    audiological, OT, full psych eval in 2008-2009  . Depression   . Idiopathic scoliosis    mild  . Problems with learning    CAP, has tutor   Past Surgical History:  Procedure Laterality Date  . ADENOIDECTOMY    . TYMPANOSTOMY TUBE PLACEMENT  2003  . WISDOM TOOTH EXTRACTION     Social History   Socioeconomic History  . Marital status: Single    Spouse name: Not on file  . Number of children: Not on file  . Years of education: Not on file  . Highest education level: Not on file  Occupational History  . Not on file  Tobacco Use  . Smoking status: Never Smoker  . Smokeless tobacco: Never Used  Vaping Use  . Vaping Use: Former  Substance and Sexual Activity  . Alcohol use: Yes    Comment: rare 1 drink  . Drug use: No  . Sexual activity: Never  Other Topics Concern  . Not on file  Social History Narrative   Lives with parents and sister   Father has cirrhosis -- developed in past year. 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 Hypertension Father   . Liver disease Father   . Seizures Father   . Ankylosing spondylitis Father   . Lupus Paternal Grandmother   . Alzheimer's  disease Maternal Grandmother   . Pancreatic cancer Neg Hx   . Stomach cancer Neg Hx   . Esophageal cancer Neg Hx   . Colon cancer Neg Hx   . Inflammatory bowel disease Neg Hx   . Rectal cancer Neg Hx    I have reviewed her medical, social, and family history in detail and updated the electronic medical record as necessary.    PHYSICAL EXAMINATION  BP 110/62   Pulse 95   Ht '5\' 6"'  (1.676 m)   Wt 195 lb 8 oz (88.7 kg)   SpO2 98%   BMI 31.55 kg/m  Wt Readings from Last 3 Encounters:  11/06/20 195 lb 8 oz (88.7 kg)  06/10/20 192 lb 9.6 oz (87.4 kg)  11/01/19 170 lb (77.1 kg) (92 %, Z= 1.38)*   * Growth percentiles are based on CDC (Girls, 2-20 Years) data.  GEN: NAD, appears stated age, doesn't appear chronically ill PSYCH: Cooperative, without pressured speech EYE: Conjunctivae pink, sclerae anicteric ENT: MMM, without oral ulcers, no erythema or exudates noted CV: RR without R/Gs  RESP: CTAB posteriorly, without wheezing GI: NABS, soft, rounded, nontender, without rebound or guarding, no HSM appreciated MSK/EXT: No lower extremity edema SKIN: No jaundice NEURO:  Alert & Oriented x 3, no focal deficits   REVIEW OF DATA  I reviewed the following data at the time of this encounter:  GI Procedures and Studies  No relevant studies to review  Laboratory Studies   Reviewed those in epic  Imaging Studies  No relevant studies to review   ASSESSMENT  Ms. Sergent is a 20 y.o. female with a pmh significant for obesity, anxiety/MDD, central auditory processing disorder.  The patient is seen today for evaluation and management of:  1. Pyrosis   2. Nausea without vomiting   3. Lower abdominal pain   4. Bloating   5. Abdominal cramping   6. Constipation, unspecified constipation type    The patient is hemodynamically stable.  Clinically she is experiencing significant signs and symptoms that are most suggestive of multiple processes occurring.  Suspect acid reflux is causing her significant issues and we would likely get some good therapeutic benefit as well as diagnostic information with a 2-week PPI trial.  I am going to initiate her on 40 mg daily PPI and see how she does.  She will start that after she gives a stool H. pylori antigen to evaluate whether she could have H. pylori infection.  We are going to obtain abdominal ultrasound imaging to evaluate some of her symptoms.  We will initiate fiber supplementation (FiberCon or Metamucil or Benefiber) in an effort of trying to optimize bowel habits and her constipation.  She may use laxative therapy if needed but hopefully fiber will get things more regulated for her.  We will see her back in a few weeks and if she still having issues we will consider at least a diagnostic endoscopy and may consider a colonoscopy.  My pretest probability of inflammatory bowel disease is low but we may need to consider additional imaging and endoscopic evaluation if everything else is unremarkable and she continues to have issues.  All patient questions were answered to the best of my ability, and the patient agrees to the aforementioned plan of action with follow-up as indicated.   PLAN  Laboratories as outlined below H. pylori stool antigen to be obtained Abdominal ultrasound to be obtained Nexium 40 mg daily (take 30  minutes before breakfast) to be initiated Diagnostic  endoscopy to be considered if patient's symptoms are persistent Diagnostic colonoscopy to be considered if patient's symptoms are persistent Consider SIBO and EPI evaluation in future Consider Bentyl/Levsin in future Initiate FiberCon or Metamucil or Benefiber once daily   Orders Placed This Encounter  Procedures  . Helicobacter pylori special antigen  . US Abdomen Complete  . CBC  . Comp Met (CMET)  . TSH  . Tissue transglutaminase, IgA  . IgA    New Prescriptions   ESOMEPRAZOLE (NEXIUM) 40 MG CAPSULE    Take 1 capsule (40 mg total) by mouth daily before breakfast.   Modified Medications   No medications on file    Planned Follow Up No follow-ups on file.   Total Time in Face-to-Face and in Coordination of Care for patient including independent/personal interpretation/review of prior testing, medical history, examination, medication adjustment, communicating results with the patient directly, and documentation with the EHR is 45 minutes.   Justice Britain, MD [facility] Office # [phone]

## 2020-11-06 NOTE — Patient Instructions (Addendum)
You have been scheduled for an abdominal ultrasound at [facility] (1st floor of hospital) on 5/24/22at 8:30am. Please arrive 15 minutes prior to your appointment for registration. Make certain not to have anything to eat or drink 6 hours prior to your appointment. Should you need to reschedule your appointment, please contact radiology at [phone]. This test typically takes about 30 minutes to perform.  Your provider has requested that you go to the basement level for lab work before leaving today. Press "B" on the elevator. The lab is located at the first door on the left as you exit the elevator.  Due to recent changes in healthcare laws, you may see the results of your imaging and laboratory studies on MyChart before your provider has had a chance to review them.  We understand that in some cases there may be results that are confusing or concerning to you. Not all laboratory results come back in the same time frame and the provider may be waiting for multiple results in order to interpret others.  Please give [place] 48 hours in order for your provider to thoroughly review all the results before contacting the office for clarification of your results.   We have sent the following medications to your pharmacy for you to pick up at your convenience: Nexium 40mg  - Take 1 capsule 30 min before breakfast daily. ( DO NOT START UNTIL AFTER YOU HAVE DONE STOOL STUDY)  Please see handout for [place] diet.   Please keep a food diary.   Please see GERD handout.   You may start Fiber Con 1-2 tablets daily as tolerated.   Please keep follow up on 12/10/20 @ 1:50pm with Dr. 12/12/20   Thank you for choosing me and [facility].  Dr. Meridee Score

## 2020-11-07 ENCOUNTER — Other Ambulatory Visit: Payer: Self-pay

## 2020-11-07 ENCOUNTER — Other Ambulatory Visit: Payer: BC Managed Care – PPO

## 2020-11-07 DIAGNOSIS — R140 Abdominal distension (gaseous): Secondary | ICD-10-CM | POA: Diagnosis not present

## 2020-11-07 DIAGNOSIS — R12 Heartburn: Secondary | ICD-10-CM

## 2020-11-07 DIAGNOSIS — K5900 Constipation, unspecified: Secondary | ICD-10-CM

## 2020-11-07 DIAGNOSIS — R110 Nausea: Secondary | ICD-10-CM | POA: Diagnosis not present

## 2020-11-07 DIAGNOSIS — R1030 Lower abdominal pain, unspecified: Secondary | ICD-10-CM

## 2020-11-07 LAB — IGA: Immunoglobulin A: 154 mg/dL (ref 47–310)

## 2020-11-07 LAB — TISSUE TRANSGLUTAMINASE, IGA: (tTG) Ab, IgA: <1 U/mL

## 2020-11-07 MED ORDER — OMEPRAZOLE 40 MG PO CPDR: 40.0000 mg | DELAYED_RELEASE_CAPSULE | Freq: Every day | ORAL | 3 refills | Status: DC

## 2020-11-08 ENCOUNTER — Other Ambulatory Visit: Payer: Self-pay

## 2020-11-08 DIAGNOSIS — R1030 Lower abdominal pain, unspecified: Secondary | ICD-10-CM

## 2020-11-08 DIAGNOSIS — R110 Nausea: Secondary | ICD-10-CM

## 2020-11-08 DIAGNOSIS — R140 Abdominal distension (gaseous): Secondary | ICD-10-CM

## 2020-11-08 DIAGNOSIS — R109 Unspecified abdominal pain: Secondary | ICD-10-CM

## 2020-11-08 LAB — HELICOBACTER PYLORI  SPECIAL ANTIGEN
MICRO NUMBER:: 11911319
SPECIMEN QUALITY: ADEQUATE

## 2020-11-12 ENCOUNTER — Ambulatory Visit (HOSPITAL_COMMUNITY)
Admission: RE | Admit: 2020-11-12 | Discharge: 2020-11-12 | Disposition: A | Discharge status: Home / Self Care | Payer: BC Managed Care – PPO | Source: Ambulatory Visit | Attending: Gastroenterology | Admitting: Gastroenterology

## 2020-11-12 ENCOUNTER — Other Ambulatory Visit: Payer: Self-pay

## 2020-11-12 ENCOUNTER — Other Ambulatory Visit (INDEPENDENT_AMBULATORY_CARE_PROVIDER_SITE_OTHER): Payer: BC Managed Care – PPO

## 2020-11-12 DIAGNOSIS — R110 Nausea: Secondary | ICD-10-CM

## 2020-11-12 DIAGNOSIS — R140 Abdominal distension (gaseous): Secondary | ICD-10-CM

## 2020-11-12 DIAGNOSIS — R12 Heartburn: Secondary | ICD-10-CM | POA: Insufficient documentation

## 2020-11-12 DIAGNOSIS — K5900 Constipation, unspecified: Secondary | ICD-10-CM | POA: Diagnosis not present

## 2020-11-12 DIAGNOSIS — R1030 Lower abdominal pain, unspecified: Secondary | ICD-10-CM | POA: Diagnosis not present

## 2020-11-12 DIAGNOSIS — R109 Unspecified abdominal pain: Secondary | ICD-10-CM

## 2020-11-12 DIAGNOSIS — F419 Anxiety disorder, unspecified: Secondary | ICD-10-CM | POA: Diagnosis not present

## 2020-11-12 LAB — AMYLASE: Amylase: 22 U/L — ABNORMAL LOW (ref 27–131)

## 2020-11-12 LAB — LIPASE: Lipase: 17 U/L (ref 11.0–59.0)

## 2020-12-05 ENCOUNTER — Encounter: Payer: Self-pay | Admitting: Family Medicine

## 2020-12-09 ENCOUNTER — Encounter (HOSPITAL_COMMUNITY): Payer: Self-pay

## 2020-12-09 ENCOUNTER — Ambulatory Visit (HOSPITAL_COMMUNITY)
Admission: RE | Admit: 2020-12-09 | Discharge: 2020-12-09 | Disposition: A | Discharge status: Home / Self Care | Payer: BC Managed Care – PPO | Source: Ambulatory Visit | Attending: Urgent Care | Admitting: Urgent Care

## 2020-12-09 ENCOUNTER — Other Ambulatory Visit: Payer: Self-pay

## 2020-12-09 VITALS — BP 121/82 | HR 92 | Temp 98.4°F | Resp 19

## 2020-12-09 DIAGNOSIS — F419 Anxiety disorder, unspecified: Secondary | ICD-10-CM | POA: Diagnosis not present

## 2020-12-09 DIAGNOSIS — H9201 Otalgia, right ear: Secondary | ICD-10-CM | POA: Diagnosis not present

## 2020-12-09 DIAGNOSIS — H6981 Other specified disorders of Eustachian tube, right ear: Secondary | ICD-10-CM

## 2020-12-09 MED ORDER — PSEUDOEPHEDRINE HCL 60 MG PO TABS: 60.0000 mg | ORAL_TABLET | Freq: Three times a day (TID) | ORAL | 0 refills | Status: DC | PRN

## 2020-12-09 MED ORDER — LEVOCETIRIZINE DIHYDROCHLORIDE 5 MG PO TABS: 5.0000 mg | ORAL_TABLET | Freq: Every evening | ORAL | 0 refills | Status: DC

## 2020-12-09 NOTE — ED Provider Notes (Signed)
[name]   MRN: [id number] DOB: 12-22-2000  Subjective:   Melissa Daniel is a 20 y.o. female presenting for 5-day history of acute onset persistent right ear pain with sinus congestion.  Reports that she has had a history of ear infections, last episode was about a year ago.  She also has a history of allergic rhinitis.  She has been using her nasal spray a bit more recently.  Denies ear drainage, tinnitus, dizziness, sinus pain, cough, sore throat.  No current facility-administered medications for this encounter.  Current Outpatient Medications:    azelastine (ASTELIN) 0.1 % nasal spray, Place 1-2 sprays into both nostrils 2 (two) times daily., Disp: , Rfl:    buPROPion (WELLBUTRIN XL) 150 MG 24 hr tablet, Take 150 mg by mouth daily., Disp: , Rfl:    Dapsone 5 % topical gel, , Disp: , Rfl:    diphenhydrAMINE (BENADRYL) 25 mg capsule, , Disp: , Rfl:    drospirenone-ethinyl estradiol (YAZ) 3-0.02 MG tablet, Take 1 tablet by mouth daily., Disp: , Rfl:    escitalopram (LEXAPRO) 10 MG tablet, Take 5 mg by mouth daily., Disp: , Rfl:    fluticasone (FLONASE) 50 MCG/ACT nasal spray, SHAKE LIQUID AND USE 1 SPRAY IN EACH NOSTRIL DAILY, Disp: 16 g, Rfl: 1   hydrOXYzine (VISTARIL) 25 MG capsule, Take 25 mg by mouth 2 (two) times daily as needed., Disp: , Rfl:    ibuprofen (ADVIL,MOTRIN) 200 MG tablet, Take 200 mg by mouth as needed. Reported on 01/03/2016, Disp: , Rfl:    levocetirizine (XYZAL) 5 MG tablet, Take 5 mg by mouth every evening., Disp: , Rfl:    Omega-3 Fatty Acids (FISH OIL PO), Take by mouth daily., Disp: , Rfl:    omeprazole (PRILOSEC) 40 MG capsule, Take 1 capsule (40 mg total) by mouth daily., Disp: 90 capsule, Rfl: 3   PREVIDENT 5000 BOOSTER PLUS 1.1 % PSTE, BRUSH WITH PEA-SIZED AMT BID AND SPIT. DO NOT EAT OR DRINK FOR 30 MIN AFTER, Disp: , Rfl:    tretinoin (RETIN-A) 0.05 % cream, , Disp: , Rfl:    No Known Allergies  Past Medical History:  Diagnosis  Date   Anxiety    Central auditory processing disorder    audiological, OT, full psych eval in 2008-2009   Depression    Idiopathic scoliosis    mild   Problems with learning    CAP, has tutor     Past Surgical History:  Procedure Laterality Date   ADENOIDECTOMY     TYMPANOSTOMY TUBE PLACEMENT  2003   WISDOM TOOTH EXTRACTION      Family History  Problem Relation Age of Onset   Hypertension Father    Liver disease Father    Seizures Father    Ankylosing spondylitis Father    Lupus Paternal Grandmother    Alzheimer's disease Maternal Grandmother    Pancreatic cancer Neg Hx    Stomach cancer Neg Hx    Esophageal cancer Neg Hx    Colon cancer Neg Hx    Inflammatory bowel disease Neg Hx    Rectal cancer Neg Hx     Social History   Tobacco Use   Smoking status: Never   Smokeless tobacco: Never  Vaping Use   Vaping Use: Former  Substance Use Topics   Alcohol use: Yes    Comment: rare 1 drink   Drug use: No    ROS   Objective:   Vitals: BP 121/82  Pulse 92   Temp 98.4 F (36.9 C)   Resp 19   LMP 12/07/2020 (Exact Date)   SpO2 97%   Physical Exam Constitutional:      General: She is not in acute distress.    Appearance: Normal appearance. She is well-developed. She is not ill-appearing, toxic-appearing or diaphoretic.  HENT:     Head: Normocephalic and atraumatic.     Right Ear: Tympanic membrane, ear canal and external ear normal. There is no impacted cerumen.     Left Ear: Tympanic membrane, ear canal and external ear normal. There is no impacted cerumen.     Nose: Nose normal.     Mouth/Throat:     Mouth: Mucous membranes are moist.     Pharynx: Oropharynx is clear.  Eyes:     General: No scleral icterus.    Extraocular Movements: Extraocular movements intact.     Pupils: Pupils are equal, round, and reactive to light.  Cardiovascular:     Rate and Rhythm: Normal rate.  Pulmonary:     Effort: Pulmonary effort is normal.  Skin:    General:  Skin is warm and dry.  Neurological:     General: No focal deficit present.     Mental Status: She is alert and oriented to person, place, and time.  Psychiatric:        Mood and Affect: Mood normal.        Behavior: Behavior normal.     Assessment and Plan :   PDMP not reviewed this encounter.  1. Otalgia of right ear   2. Eustachian tube dysfunction, right     Unremarkable ENT exam.  Will use conservative management for what I suspect is eustachian tube dysfunction.  Recommended maintaining Flonase, start Xyzal, Sudafed. Counseled patient on potential for adverse effects with medications prescribed/recommended today, ER and return-to-clinic precautions discussed, patient verbalized understanding.    Wallis Bamberg, PA-C 12/09/20 1626

## 2020-12-09 NOTE — ED Triage Notes (Signed)
Pt presents with complaints of an earache x 5 days and mild nasal congestion. Patient is prone to ear infections.

## 2020-12-10 ENCOUNTER — Ambulatory Visit: Payer: BC Managed Care – PPO | Admitting: Family Medicine

## 2020-12-10 ENCOUNTER — Ambulatory Visit (INDEPENDENT_AMBULATORY_CARE_PROVIDER_SITE_OTHER): Payer: BC Managed Care – PPO | Admitting: Gastroenterology

## 2020-12-10 ENCOUNTER — Encounter: Payer: Self-pay | Admitting: Gastroenterology

## 2020-12-10 ENCOUNTER — Other Ambulatory Visit: Payer: BC Managed Care – PPO

## 2020-12-10 VITALS — BP 110/60 | HR 72 | Ht 66.0 in | Wt 192.4 lb

## 2020-12-10 DIAGNOSIS — R141 Gas pain: Secondary | ICD-10-CM | POA: Diagnosis not present

## 2020-12-10 DIAGNOSIS — R140 Abdominal distension (gaseous): Secondary | ICD-10-CM | POA: Diagnosis not present

## 2020-12-10 DIAGNOSIS — K219 Gastro-esophageal reflux disease without esophagitis: Secondary | ICD-10-CM

## 2020-12-10 DIAGNOSIS — R12 Heartburn: Secondary | ICD-10-CM | POA: Diagnosis not present

## 2020-12-10 DIAGNOSIS — K5900 Constipation, unspecified: Secondary | ICD-10-CM

## 2020-12-10 MED ORDER — OMEPRAZOLE 40 MG PO CPDR: 40.0000 mg | DELAYED_RELEASE_CAPSULE | Freq: Every day | ORAL | 1 refills | Status: DC

## 2020-12-10 NOTE — Patient Instructions (Addendum)
We have sent the following medications to your pharmacy for you to pick up at your convenience: Omeprazole   Your provider has requested that you go to the basement level for lab work before leaving today. Press "B" on the elevator. The lab is located at the first door on the left as you exit the elevator.   You may use Gas -X  2-4 times daily as needed.   Start Fiber Con- Take 1-2 tablets daily as tolerated.  Please keep follow up appt on 01/21/21 @ 9:10am with Dr. Meridee Score   If you are age 81 or younger, your body mass index should be between 19-25. Your Body mass index is 31.05 kg/m. If this is out of the aformentioned range listed, please consider follow up with your Primary Care Provider.   __________________________________________________________  The [facility] providers would like to encourage you to use [facility] to communicate with providers for non-urgent requests or questions.  Due to long hold times on the telephone, sending your provider a message by [facility] may be a faster and more efficient way to get a response.  Please allow 48 business hours for a response.  Please remember that this is for non-urgent requests.   Thank you for choosing me and [facility].  Dr. Meridee Score

## 2020-12-10 NOTE — Progress Notes (Signed)
GASTROENTEROLOGY OUTPATIENT CLINIC VISIT   Primary Care Provider Wynn Banker, MD [address] [place] [address] [phone]  Patient Profile: Melissa Daniel is a 20 y.o. female with a pmh significant for obesity, anxiety/MDD, central auditory processing disorder.  The patient presents to the [facility] for an evaluation and management of problem(s) noted below:  Problem List 1. Gastroesophageal reflux disease, unspecified whether esophagitis present   2. Pyrosis   3. Bloating   4. Gas pain   5. Constipation, unspecified constipation type      History of Present Illness Please see initial consultation note for full details of HPI.  Interval History The patient returns for scheduled follow-up.  The addition of PPI therapy has significantly improved her pyrosis and GERD symptoms.  She denies any dysphagia or odynophagia.  She takes the PPI once daily.  Her nausea has improved somewhat as well.  In regards to her abdominal discomfort and bloating these also have improved but she continues to have bloating and gas.  She has not used Beano or simethicone/Gas-X.  This occurs every few days.  Constipation has improved by eating better.  She is drinking more fluids.  She is not taking any fiber.  Patient denies any blood in stools.  She is 50 to 60% better than where she was since her last visit.  She remains out of school on summer break at this time and feels that many of her stresses are improved as well.  She is going to be going on vacation soon with her family.  GI Review of Systems Positive as above Negative for melena, hematochezia, decreased appetite   Review of Systems General: Denies fevers/chills/weight loss unintentionally Cardiovascular: Denies chest pain/palpitations Pulmonary: Denies shortness of breath Gastroenterological: See HPI Genitourinary: Denies darkened urine Hematological: Denies easy  bruising/bleeding Dermatological: Denies jaundice Psychological: Mood is stable   Medications Current Outpatient Medications  Medication Sig Dispense Refill   azelastine (ASTELIN) 0.1 % nasal spray Place 1-2 sprays into both nostrils 2 (two) times daily.     buPROPion (WELLBUTRIN XL) 150 MG 24 hr tablet Take 150 mg by mouth daily.     Dapsone 5 % topical gel      diphenhydrAMINE (BENADRYL) 25 mg capsule      drospirenone-ethinyl estradiol (YAZ) 3-0.02 MG tablet Take 1 tablet by mouth daily.     fluticasone (FLONASE) 50 MCG/ACT nasal spray SHAKE LIQUID AND USE 1 SPRAY IN EACH NOSTRIL DAILY 16 g 1   hydrOXYzine (VISTARIL) 25 MG capsule Take 25 mg by mouth 2 (two) times daily as needed.     ibuprofen (ADVIL,MOTRIN) 200 MG tablet Take 200 mg by mouth as needed. Reported on 01/03/2016     levocetirizine (XYZAL) 5 MG tablet Take 1 tablet (5 mg total) by mouth every evening. 90 tablet 0   Omega-3 Fatty Acids (FISH OIL PO) Take by mouth daily.     PREVIDENT 5000 BOOSTER PLUS 1.1 % PSTE BRUSH WITH PEA-SIZED AMT BID AND SPIT. DO NOT EAT OR DRINK FOR 30 MIN AFTER     pseudoephedrine (SUDAFED) 60 MG tablet Take 1 tablet (60 mg total) by mouth every 8 (eight) hours as needed for congestion. 30 tablet 0   tretinoin (RETIN-A) 0.05 % cream      escitalopram (LEXAPRO) 10 MG tablet Take 5 mg by mouth daily.     omeprazole (PRILOSEC) 40 MG capsule Take 1 capsule (40 mg total) by mouth daily. 90 capsule 1  No current facility-administered medications for this visit.    Allergies No Known Allergies  Histories Past Medical History:  Diagnosis Date   Anxiety    Central auditory processing disorder    audiological, OT, full psych eval in 2008-2009   Depression    Idiopathic scoliosis    mild   Problems with learning    CAP, has tutor   Past Surgical History:  Procedure Laterality Date   ADENOIDECTOMY     TYMPANOSTOMY TUBE PLACEMENT  2003   WISDOM TOOTH EXTRACTION     Social History    Socioeconomic History   Marital status: Single    Spouse name: Not on file   Number of children: Not on file   Years of education: Not on file   Highest education level: Not on file  Occupational History   Not on file  Tobacco Use   Smoking status: Never   Smokeless tobacco: Never  Vaping Use   Vaping Use: Former  Substance and Sexual Activity   Alcohol use: Yes    Comment: rare 1 drink   Drug use: No   Sexual activity: Never  Other Topics Concern   Not on file  Social History Narrative   Lives with parents and sister   Father has cirrhosis -- developed in past year. 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 and in Car: wears seatbelts, no guns in home   Social Determinants of Health   Financial Resource Strain: Not on file  Food Insecurity: Not on file  Transportation Needs: Not on file  Physical Activity: Not on file  Stress: Not on file  Social Connections: Not on file  Intimate Partner Violence: Not on file   Family History  Problem Relation Age of Onset   Hypertension Father    Liver disease Father    Seizures Father    Ankylosing spondylitis Father    Lupus Paternal Grandmother    Alzheimer's disease Maternal Grandmother    Pancreatic cancer Neg Hx    Stomach cancer Neg Hx    Esophageal cancer Neg Hx    Colon cancer Neg Hx    Inflammatory bowel disease Neg Hx    Rectal cancer Neg Hx    I have reviewed her medical, social, and family history in detail and updated the electronic medical record as necessary.    PHYSICAL EXAMINATION  BP 110/60   Pulse 72   Ht 5\' 6"  (1.676  m)   Wt 192 lb 6.4 oz (87.3 kg)   LMP 12/07/2020 (Exact Date)   BMI 31.05 kg/m  Wt Readings from Last 3 Encounters:  12/10/20 192 lb 6.4 oz (87.3 kg)  11/06/20 195 lb 8 oz (88.7 kg)  06/10/20 192 lb 9.6 oz (87.4 kg)  GEN: NAD, appears stated age, doesn't appear chronically ill PSYCH: Cooperative, without pressured speech EYE: Conjunctivae pink, sclerae anicteric ENT: MMM CV: Nontachycardic RESP: No audible wheezing GI: NABS, soft, rounded, nontender, without rebound or guarding MSK/EXT: No  lower extremity edema SKIN: No jaundice NEURO:  Alert & Oriented x 3, no focal deficits   REVIEW OF DATA  I reviewed the following data at the time of this encounter:  GI Procedures and Studies  No relevant studies to review  Laboratory Studies  Reviewed those in epic  Imaging Studies  May 2022 abdominal ultrasound IMPRESSION: No significant sonographic abnormality abdomen.   ASSESSMENT  Ms. Stumpp is a 20 y.o. female with a pmh significant for obesity, anxiety/MDD, central auditory processing disorder.  The patient is seen today for evaluation and management of:  1. Gastroesophageal reflux disease, unspecified whether esophagitis present   2. Pyrosis   3. Bloating   4. Gas pain   5. Constipation, unspecified constipation type    Patient is hemodynamically stable.  The patient is clinically improved since our last visit.  PPI therapy has been helpful for her GERD symptoms.  She also has had some improvement in her abdominal discomfort as well as bloating but not to full extent.  Constipation is at this time.  We will continue her PPI therapy for now.  I have asked her to initiate a fiber supplement, FiberCon, in an effort of trying to further aid her constipation issues.  I will ask her to initiate Gas-X extra strength 1-4 times per day especially when the abdominal gas/bloating is lasting more than 15 minutes.  We will exclude exocrine pancreas insufficiency with fecal elastase  testing.  Unlikely that this is truly inflammatory bowel disease and more likely some component of functional abdominal pain.  With that being said, if issues persist in follow-up then will need to consider diagnostic endoscopy and potentially colonoscopy.  Further cross-sectional imaging.  All patient questions were answered to the best of my ability, and the patient agrees to the aforementioned plan of action with follow-up as indicated.   PLAN  Stool studies as outlined below Continue PPI 40 mg daily for now and at next visit if still doing well consider decreasing to 20 mg Consider SIBO breath testing in future Initiate FiberCon We will consider diagnostic endoscopy and potentially colonoscopy if symptoms persist Holding on Levsin/Bentyl for now Gas-X extra strength 1-4 times daily as needed if gas/bloating last more than 15 minutes   Orders Placed This Encounter  Procedures   Pancreatic elastase, fecal     New Prescriptions   No medications on file   Modified Medications   Modified Medication Previous Medication   OMEPRAZOLE (PRILOSEC) 40 MG CAPSULE omeprazole (PRILOSEC) 40 MG capsule      Take 1 capsule (40 mg total) by mouth daily.    Take 1 capsule (40 mg total) by mouth daily.    Planned Follow Up No follow-ups on file.   Total Time in Face-to-Face and in Coordination of Care for patient including independent/personal interpretation/review of prior testing, medical history, examination, medication adjustment, communicating results with the patient directly, and documentation with the EHR is 25 minutes.   Corliss Parish, MD [facility]oscopy Office # [phone]

## 2020-12-11 ENCOUNTER — Other Ambulatory Visit: Payer: BC Managed Care – PPO

## 2020-12-11 DIAGNOSIS — K219 Gastro-esophageal reflux disease without esophagitis: Secondary | ICD-10-CM

## 2020-12-11 DIAGNOSIS — R140 Abdominal distension (gaseous): Secondary | ICD-10-CM

## 2020-12-11 DIAGNOSIS — R141 Gas pain: Secondary | ICD-10-CM

## 2020-12-15 LAB — PANCREATIC ELASTASE, FECAL: Pancreatic Elastase-1, Stool: >500 mcg/g

## 2020-12-31 DIAGNOSIS — F419 Anxiety disorder, unspecified: Secondary | ICD-10-CM | POA: Diagnosis not present

## 2021-01-17 ENCOUNTER — Other Ambulatory Visit: Payer: Self-pay

## 2021-01-20 ENCOUNTER — Encounter: Payer: Self-pay | Admitting: Family Medicine

## 2021-01-20 ENCOUNTER — Other Ambulatory Visit: Payer: Self-pay

## 2021-01-20 ENCOUNTER — Ambulatory Visit (INDEPENDENT_AMBULATORY_CARE_PROVIDER_SITE_OTHER): Payer: BC Managed Care – PPO | Admitting: Family Medicine

## 2021-01-20 VITALS — BP 122/80 | HR 95 | Temp 97.9°F | Ht 66.0 in | Wt 191.6 lb

## 2021-01-20 DIAGNOSIS — Z113 Encounter for screening for infections with a predominantly sexual mode of transmission: Secondary | ICD-10-CM

## 2021-01-20 DIAGNOSIS — Z1159 Encounter for screening for other viral diseases: Secondary | ICD-10-CM | POA: Diagnosis not present

## 2021-01-20 DIAGNOSIS — E538 Deficiency of other specified B group vitamins: Secondary | ICD-10-CM

## 2021-01-20 DIAGNOSIS — Z13220 Encounter for screening for lipoid disorders: Secondary | ICD-10-CM

## 2021-01-20 DIAGNOSIS — R7309 Other abnormal glucose: Secondary | ICD-10-CM

## 2021-01-20 DIAGNOSIS — R5383 Other fatigue: Secondary | ICD-10-CM | POA: Diagnosis not present

## 2021-01-20 DIAGNOSIS — N946 Dysmenorrhea, unspecified: Secondary | ICD-10-CM

## 2021-01-20 DIAGNOSIS — E559 Vitamin D deficiency, unspecified: Secondary | ICD-10-CM | POA: Diagnosis not present

## 2021-01-20 LAB — COMPREHENSIVE METABOLIC PANEL
ALT: 13 U/L (ref 0–35)
AST: 14 U/L (ref 0–37)
Albumin: 4.1 g/dL (ref 3.5–5.2)
Alkaline Phosphatase: 66 U/L (ref 39–117)
BUN: 9 mg/dL (ref 6–23)
CO2: 24 mEq/L (ref 19–32)
Calcium: 9.4 mg/dL (ref 8.4–10.5)
Chloride: 103 mEq/L (ref 96–112)
Creatinine, Ser: 0.82 mg/dL (ref 0.40–1.20)
GFR: 102.51 mL/min (ref >60.00)
Glucose, Bld: 86 mg/dL (ref 70–99)
Potassium: 4.2 mEq/L (ref 3.5–5.1)
Sodium: 137 mEq/L (ref 135–145)
Total Bilirubin: 0.5 mg/dL (ref 0.2–1.2)
Total Protein: 6.9 g/dL (ref 6.0–8.3)

## 2021-01-20 LAB — CBC WITH DIFFERENTIAL/PLATELET
Basophils Absolute: 0.1 10*3/uL (ref 0.0–0.1)
Basophils Relative: 1.3 % (ref 0.0–3.0)
Eosinophils Absolute: 0.1 10*3/uL (ref 0.0–0.7)
Eosinophils Relative: 1 % (ref 0.0–5.0)
HCT: 39.7 % (ref 36.0–46.0)
Hemoglobin: 13.3 g/dL (ref 12.0–15.0)
Lymphocytes Relative: 30.8 % (ref 12.0–46.0)
Lymphs Abs: 2.2 10*3/uL (ref 0.7–4.0)
MCHC: 33.5 g/dL (ref 30.0–36.0)
MCV: 84.3 fl (ref 78.0–100.0)
Monocytes Absolute: 0.5 10*3/uL (ref 0.1–1.0)
Monocytes Relative: 6.6 % (ref 3.0–12.0)
Neutro Abs: 4.3 10*3/uL (ref 1.4–7.7)
Neutrophils Relative %: 60.3 % (ref 43.0–77.0)
Platelets: 277 10*3/uL (ref 150.0–400.0)
RBC: 4.71 Mil/uL (ref 3.87–5.11)
RDW: 12.5 % (ref 11.5–15.5)
WBC: 7.2 10*3/uL (ref 4.0–10.5)

## 2021-01-20 LAB — LIPID PANEL
Cholesterol: 235 mg/dL — ABNORMAL HIGH (ref 0–200)
HDL: 71 mg/dL (ref >39.00)
LDL Cholesterol: 135 mg/dL — ABNORMAL HIGH (ref 0–99)
NonHDL: 164.04
Total CHOL/HDL Ratio: 3
Triglycerides: 145 mg/dL (ref 0.0–149.0)
VLDL: 29 mg/dL (ref 0.0–40.0)

## 2021-01-20 LAB — TSH: TSH: 1.47 u[IU]/mL (ref 0.35–5.50)

## 2021-01-20 LAB — HEMOGLOBIN A1C: Hgb A1c MFr Bld: 5.4 % (ref 4.6–6.5)

## 2021-01-20 LAB — CORTISOL: Cortisol, Plasma: 18.3 ug/dL

## 2021-01-20 LAB — VITAMIN B12: Vitamin B-12: 156 pg/mL — ABNORMAL LOW (ref 211–911)

## 2021-01-20 NOTE — Addendum Note (Signed)
Addended by: Kandra Nicolas on: 01/20/2021 10:06 AM   Modules accepted: Orders

## 2021-01-20 NOTE — Progress Notes (Signed)
Elpidio Eric DOB: 27-Jul-2000 Encounter date: 01/20/2021  This is a 20 y.o. female who presents with Chief Complaint  Patient presents with   patient requests Hep C screening test    History of present illness:  Last visit with me was 05/2020. Last physical 11/19/19.   She is set up for pap with Dr. Velvet Bathe this week. She is on Yaz, but going to talk with gyn about trying something different. Not sure if that is what is making her feel sluggish, bloated.   Mood has been alright. Nothing abnormal for her. Sometimes getting mood swings. Wellbutrin 150mg  daily, 5mg  lexapro. Vistaril if needed. Usually more at night.   Still taking omeprazole - started per GI in May. Has follow up tomorrow.    Right ear is better; had ETD and went away with recommended treatment.    Has gained a lot of weight in the last year and a half. Has been decreasing lexapro with psych because of this. Sometimes feels like she can't get through day without nap or without feeling tired. Does feel like she is sleeping well at night, esp in past month. Even with this feels tired mid day. Usually fees rested in the morning. Doesn't feel like much has changed with what she is eating that is contributing to weight gain. Working on moderate exercise, but harder to find motivation/energy. Cramping 4-5 days before period and then first 2-3 days doesn't feel well.   No Known Allergies Current Meds  Medication Sig   azelastine (ASTELIN) 0.1 % nasal spray Place 1-2 sprays into both nostrils 2 (two) times daily.   buPROPion (WELLBUTRIN XL) 150 MG 24 hr tablet Take 150 mg by mouth daily.   Dapsone 5 % topical gel    diphenhydrAMINE (BENADRYL) 25 mg capsule    drospirenone-ethinyl estradiol (YAZ) 3-0.02 MG tablet Take 1 tablet by mouth daily.   escitalopram (LEXAPRO) 5 MG tablet Take 5 mg by mouth daily.   fluticasone (FLONASE) 50 MCG/ACT nasal spray SHAKE LIQUID AND USE 1 SPRAY IN EACH NOSTRIL DAILY   hydrOXYzine (VISTARIL)  25 MG capsule Take 25 mg by mouth 2 (two) times daily as needed.   ibuprofen (ADVIL,MOTRIN) 200 MG tablet Take 200 mg by mouth as needed. Reported on 01/03/2016   Omega-3 Fatty Acids (FISH OIL PO) Take by mouth daily.   omeprazole (PRILOSEC) 40 MG capsule Take 1 capsule (40 mg total) by mouth daily.   PREVIDENT 5000 BOOSTER PLUS 1.1 % PSTE BRUSH WITH PEA-SIZED AMT BID AND SPIT. DO NOT EAT OR DRINK FOR 30 MIN AFTER   tretinoin (RETIN-A) 0.05 % cream     Review of Systems  Constitutional:  Negative for chills, fatigue and fever.  Respiratory:  Negative for cough, chest tightness, shortness of breath and wheezing.   Cardiovascular:  Negative for chest pain, palpitations and leg swelling.   Objective:  BP 122/80 (BP Location: Left Arm, Patient Position: Sitting, Cuff Size: Normal)   Pulse 95   Temp 97.9 F (36.6 C) (Rectal)   Ht 5\' 6"  (1.676 m)   Wt 191 lb 9.6 oz (86.9 kg)   LMP 01/07/2021 (Exact Date)   BMI 30.93 kg/m   Weight: 191 lb 9.6 oz (86.9 kg)   BP Readings from Last 3 Encounters:  01/20/21 122/80  12/10/20 110/60  12/09/20 121/82   Wt Readings from Last 3 Encounters:  01/20/21 191 lb 9.6 oz (86.9 kg)  12/10/20 192 lb 6.4 oz (87.3 kg)  11/06/20 195 lb 8  oz (88.7 kg)    Physical Exam Constitutional:      General: She is not in acute distress.    Appearance: She is well-developed.  Cardiovascular:     Rate and Rhythm: Normal rate and regular rhythm.     Heart sounds: Normal heart sounds. No murmur heard.   No friction rub.  Pulmonary:     Effort: Pulmonary effort is normal. No respiratory distress.     Breath sounds: Normal breath sounds. No wheezing or rales.  Musculoskeletal:     Right lower leg: No edema.     Left lower leg: No edema.  Neurological:     Mental Status: She is alert and oriented to person, place, and time.  Psychiatric:        Behavior: Behavior normal.    Assessment/Plan  1. Encounter for hepatitis C screening test for low risk patient -  Hepatitis C antibody; Future  2. Elevated glucose Keep working on getting regular exercise.  - Comprehensive metabolic panel; Future - Hemoglobin A1c; Future  3. Vitamin D deficiency She is currently not supplementing  4. Low serum vitamin B12 - Vitamin B12; Future - Homocysteine; Future - Methylmalonic acid, serum; Future  5. Lipid screening - Lipid panel; Future  6. Dysmenorrhea Yaz doesn't seem to be helping as much; she will talk with gyn about this.  - CBC with Differential/Platelet; Future - TSH; Future  7. Screening for STD (sexually transmitted disease) - Hepatitis C antibody; Future - HIV Antibody (routine testing w rflx); Future - RPR; Future  8. Fatigue, unspecified type - Cortisol   Return for pending lab results.      Theodis Shove, MD

## 2021-01-21 ENCOUNTER — Encounter: Payer: Self-pay | Admitting: Gastroenterology

## 2021-01-21 ENCOUNTER — Ambulatory Visit (INDEPENDENT_AMBULATORY_CARE_PROVIDER_SITE_OTHER): Payer: BC Managed Care – PPO | Admitting: Gastroenterology

## 2021-01-21 VITALS — BP 110/72 | HR 90 | Ht 66.0 in | Wt 192.0 lb

## 2021-01-21 DIAGNOSIS — K219 Gastro-esophageal reflux disease without esophagitis: Secondary | ICD-10-CM

## 2021-01-21 DIAGNOSIS — R12 Heartburn: Secondary | ICD-10-CM

## 2021-01-21 DIAGNOSIS — R140 Abdominal distension (gaseous): Secondary | ICD-10-CM

## 2021-01-21 DIAGNOSIS — R292 Abnormal reflex: Secondary | ICD-10-CM

## 2021-01-21 MED ORDER — OMEPRAZOLE 40 MG PO CPDR: 40.0000 mg | DELAYED_RELEASE_CAPSULE | Freq: Two times a day (BID) | ORAL | 1 refills | Status: DC

## 2021-01-21 NOTE — Progress Notes (Signed)
GASTROENTEROLOGY OUTPATIENT CLINIC VISIT   Primary Care Provider Wynn Banker, MD [address] [place] [address] [phone]  Patient Profile: Melissa Daniel is a 20 y.o. female with a pmh significant for obesity, anxiety/MDD, central auditory processing disorder.  The patient presents to the [facility] for an evaluation and management of problem(s) noted below:  Problem List 1. Pyrosis   2. Bloating   3. Gastroesophageal reflux disease, unspecified whether esophagitis present       History of Present Illness Please see initial consultation note and prior progress notes for full details of HPI.  Interval History The patient returns for scheduled follow-up.  The patient states that as long as she is watching what she is eating "eating healthy" that bloating symptoms are not too significant.  She does use Gas-X as needed at times.  Bowels are moving better than they had been previously.  Patient still experiencing pyrosis symptoms even while on once daily dosing of PPI.  She never decreased the dose.  She has never been on twice daily PPI.  She will be heading back to school in a few weeks.  She is hopeful that things will improve.  We discussed the possibility of a diagnostic endoscopy as well.  She denies any dysphagia symptoms.  GI Review of Systems Positive as above Negative for odynophagia, nausea, vomiting, alteration bowel habits, melena, hematochezia   Review of Systems General: Denies fevers/chills/weight loss unintentionally Cardiovascular: Denies chest pain/palpitations Pulmonary: Denies shortness of breath Gastroenterological: See HPI Genitourinary: Denies darkened urine Hematological: Denies easy bruising/bleeding Dermatological: Denies jaundice Psychological: Mood is stable   Medications Current Outpatient Medications  Medication Sig Dispense Refill   azelastine (ASTELIN) 0.1 % nasal spray Place 1-2 sprays into  both nostrils 2 (two) times daily.     buPROPion (WELLBUTRIN XL) 150 MG 24 hr tablet Take 150 mg by mouth daily.     Dapsone 5 % topical gel      diphenhydrAMINE (BENADRYL) 25 mg capsule      drospirenone-ethinyl estradiol (YAZ) 3-0.02 MG tablet Take 1 tablet by mouth daily.     escitalopram (LEXAPRO) 5 MG tablet Take 5 mg by mouth daily.     fluticasone (FLONASE) 50 MCG/ACT nasal spray SHAKE LIQUID AND USE 1 SPRAY IN EACH NOSTRIL DAILY 16 g 1   hydrOXYzine (VISTARIL) 25 MG capsule Take 25 mg by mouth 2 (two) times daily as needed.     ibuprofen (ADVIL,MOTRIN) 200 MG tablet Take 200 mg by mouth as needed. Reported on 01/03/2016     Omega-3 Fatty Acids (FISH OIL PO) Take by mouth daily.     PREVIDENT 5000 BOOSTER PLUS 1.1 % PSTE BRUSH WITH PEA-SIZED AMT BID AND SPIT. DO NOT EAT OR DRINK FOR 30 MIN AFTER     tretinoin (RETIN-A) 0.05 % cream      omeprazole (PRILOSEC) 40 MG capsule Take 1 capsule (40 mg total) by mouth 2 (two) times daily. 90 capsule 1   No current facility-administered medications for this visit.    Allergies No Known Allergies  Histories Past Medical History:  Diagnosis Date   Anxiety    Central auditory processing disorder    audiological, OT, full psych eval in 2008-2009   Depression    Idiopathic scoliosis    mild   Problems with learning    CAP, has tutor   Past Surgical History:  Procedure Laterality Date   ADENOIDECTOMY     TYMPANOSTOMY TUBE PLACEMENT  2003  WISDOM TOOTH EXTRACTION     Social History   Socioeconomic History   Marital status: Single    Spouse name: Not on file   Number of children: Not on file   Years of education: Not on file   Highest education level: Not on file  Occupational History   Not on file  Tobacco Use   Smoking status: Never   Smokeless tobacco: Never  Vaping Use   Vaping Use: Former  Substance and Sexual Activity   Alcohol use: Yes    Comment: rare 1 drink   Drug use: No   Sexual activity: Never  Other Topics  Concern   Not on file  Social History Narrative   Lives with parents and sister   Father has cirrhosis -- developed in past year. 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 and in Car: wears seatbelts, no guns in home   Social Determinants of Health   Financial Resource Strain: Not on file  Food Insecurity: Not on file  Transportation Needs: Not on file  Physical Activity: Not on file  Stress: Not on file  Social Connections: Not on file  Intimate Partner Violence: Not on file   Family History  Problem Relation Age of Onset   Hypertension Father    Liver disease Father    Seizures Father    Ankylosing spondylitis Father    Lupus Paternal Grandmother    Alzheimer's disease Maternal Grandmother    Pancreatic cancer Neg Hx    Stomach cancer Neg Hx    Esophageal cancer Neg Hx    Colon cancer Neg Hx    Inflammatory bowel disease Neg Hx    Rectal cancer Neg Hx    I have reviewed her medical, social, and family history in detail and updated the electronic medical record as necessary.    PHYSICAL EXAMINATION  BP 110/72   Pulse 90   Ht 5\' 6"  (1.676 m)   Wt 192 lb (87.1 kg)   LMP 01/07/2021 (Exact Date)   BMI 30.99 kg/m  Wt Readings from Last 3 Encounters:  01/21/21 192 lb (87.1 kg)  01/20/21 191 lb 9.6 oz (86.9 kg)  12/10/20 192 lb 6.4 oz (87.3 kg)  GEN: NAD, appears stated age, doesn't appear chronically ill PSYCH: Cooperative, without pressured speech EYE: Conjunctivae pink, sclerae anicteric ENT: MMM CV: Nontachycardic RESP: No audible wheezing GI: NABS, soft, rounded, nontender, without  rebound or guarding MSK/EXT: No lower extremity edema SKIN: No jaundice NEURO:  Alert & Oriented x 3, no focal deficits   REVIEW OF DATA  I reviewed the following data at the time of this encounter:  GI Procedures and Studies  No relevant studies to review  Laboratory Studies  Reviewed those in epic  Imaging Studies  No new imaging to review   ASSESSMENT  Ms. Stout is a 20 y.o. female with a pmh significant for obesity, anxiety/MDD, central auditory processing disorder.  The patient is seen today for evaluation and  management of:  1. Pyrosis   2. Bloating   3. Gastroesophageal reflux disease, unspecified whether esophagitis present    The patient is hemodynamically stable.  Clinically compared to where she was previously it seems that her pyrosis symptoms have not improved as we would expect on such a long course of treatment with PPI therapy.  She was offered a diagnostic endoscopy to rule out eosinophilic esophagitis/lymphocytic esophagitis with the possibility that the patient could be experiencing esophageal hypersensitivity or functional heartburn rather than true GERD.  She has deferred on this and would like to trial twice daily PPI for period in time.  If she continues to have issues then a diagnostic endoscopy should be pursued with the possibility of pH impedance testing and manometry in the future.  Bowels are moving a little bit better at this time.  She will not need a diagnostic colonoscopy at this time.  Pending how the patient does we will consider cross-sectional imaging.  The patient will trial the twice daily PPI for the next 4 weeks and then let us know how she does all patient questions were answered to the best of my ability, and the patient agrees to the aforementioned plan of action with follow-up as indicated.  PLAN  Increase PPI to 40 mg twice daily for the next few weeks Patient continues to have issues would consider diagnostic endoscopy with biopsies and  potential pH impedance testing May continue Gas-X as needed Patient will try to monitor food intake and see how her symptoms are doing more closely in regards to food that may be high FODMAP She will continue FiberCon once to twice daily Follow-up to be dictated by patient's needs and she will MyChart [place]   No orders of the defined types were placed in this encounter.    New Prescriptions   No medications on file   Modified Medications   Modified Medication Previous Medication   OMEPRAZOLE (PRILOSEC) 40 MG CAPSULE omeprazole (PRILOSEC) 40 MG capsule      Take 1 capsule (40 mg total) by mouth 2 (two) times daily.    Take 1 capsule (40 mg total) by mouth daily.    Planned Follow Up No follow-ups on file.   Total Time in Face-to-Face and in Coordination of Care for patient including independent/personal interpretation/review of prior testing, medical history, examination, medication adjustment, communicating results with the patient directly, and documentation with the EHR is 25 minutes.   Corliss Parish, MD [facility] Office # [phone]

## 2021-01-21 NOTE — Patient Instructions (Signed)
Increase your Omeprazole to twice daily.   We have sent the following medications to your pharmacy for you to pick up at your convenience: Omeprazole   Send a mychart message in 2-4 weeks letting us know how you are doing.   If you are age 21 or older, your body mass index should be between 23-30. Your Body mass index is 30.99 kg/m. If this is out of the aforementioned range listed, please consider follow up with your Primary Care Provider.  If you are age 22 or younger, your body mass index should be between 19-25. Your Body mass index is 30.99 kg/m. If this is out of the aformentioned range listed, please consider follow up with your Primary Care Provider.   __________________________________________________________  The [facility] providers would like to encourage you to use [facility] to communicate with providers for non-urgent requests or questions.  Due to long hold times on the telephone, sending your provider a message by [facility] may be a faster and more efficient way to get a response.  Please allow 48 business hours for a response.  Please remember that this is for non-urgent requests.   Thank you for choosing me and [facility].  Dr. Meridee Score

## 2021-01-22 ENCOUNTER — Encounter: Payer: Self-pay | Admitting: Gastroenterology

## 2021-01-22 DIAGNOSIS — Z113 Encounter for screening for infections with a predominantly sexual mode of transmission: Secondary | ICD-10-CM | POA: Diagnosis not present

## 2021-01-22 DIAGNOSIS — Z01419 Encounter for gynecological examination (general) (routine) without abnormal findings: Secondary | ICD-10-CM | POA: Diagnosis not present

## 2021-01-22 DIAGNOSIS — Z6830 Body mass index (BMI) 30.0-30.9, adult: Secondary | ICD-10-CM | POA: Diagnosis not present

## 2021-01-23 LAB — METHYLMALONIC ACID, SERUM: Methylmalonic Acid, Quant: 333 nmol/L — ABNORMAL HIGH (ref 87–318)

## 2021-01-23 LAB — RPR: RPR Ser Ql: NONREACTIVE

## 2021-01-23 LAB — HIV ANTIBODY (ROUTINE TESTING W REFLEX): HIV 1&2 Ab, 4th Generation: NONREACTIVE

## 2021-01-23 LAB — HEPATITIS C ANTIBODY
Hepatitis C Ab: NONREACTIVE
SIGNAL TO CUT-OFF: 0.01 (ref <1.00)

## 2021-01-23 LAB — HOMOCYSTEINE: Homocysteine: 7.3 umol/L (ref <10.4)

## 2021-01-27 ENCOUNTER — Encounter: Payer: Self-pay | Admitting: Family Medicine

## 2021-01-28 DIAGNOSIS — F419 Anxiety disorder, unspecified: Secondary | ICD-10-CM | POA: Diagnosis not present

## 2021-02-05 DIAGNOSIS — Z3202 Encounter for pregnancy test, result negative: Secondary | ICD-10-CM | POA: Diagnosis not present

## 2021-02-05 DIAGNOSIS — Z30430 Encounter for insertion of intrauterine contraceptive device: Secondary | ICD-10-CM | POA: Diagnosis not present

## 2021-03-03 DIAGNOSIS — F419 Anxiety disorder, unspecified: Secondary | ICD-10-CM | POA: Diagnosis not present

## 2021-03-06 ENCOUNTER — Encounter: Payer: Self-pay | Admitting: Family Medicine

## 2021-03-17 DIAGNOSIS — Z3040 Encounter for surveillance of contraceptives, unspecified: Secondary | ICD-10-CM | POA: Diagnosis not present

## 2021-03-17 DIAGNOSIS — N760 Acute vaginitis: Secondary | ICD-10-CM | POA: Diagnosis not present

## 2021-03-17 DIAGNOSIS — L709 Acne, unspecified: Secondary | ICD-10-CM | POA: Diagnosis not present

## 2021-03-17 DIAGNOSIS — Z3009 Encounter for other general counseling and advice on contraception: Secondary | ICD-10-CM | POA: Diagnosis not present

## 2021-03-24 DIAGNOSIS — F419 Anxiety disorder, unspecified: Secondary | ICD-10-CM | POA: Diagnosis not present

## 2021-04-14 ENCOUNTER — Encounter: Payer: Self-pay | Admitting: Family Medicine

## 2021-04-16 DIAGNOSIS — R509 Fever, unspecified: Secondary | ICD-10-CM | POA: Diagnosis not present

## 2021-04-16 DIAGNOSIS — J101 Influenza due to other identified influenza virus with other respiratory manifestations: Secondary | ICD-10-CM | POA: Diagnosis not present

## 2021-04-28 DIAGNOSIS — F419 Anxiety disorder, unspecified: Secondary | ICD-10-CM | POA: Diagnosis not present

## 2021-05-07 ENCOUNTER — Encounter: Payer: Self-pay | Admitting: Family Medicine

## 2021-05-13 DIAGNOSIS — R631 Polydipsia: Secondary | ICD-10-CM | POA: Diagnosis not present

## 2021-05-13 DIAGNOSIS — R252 Cramp and spasm: Secondary | ICD-10-CM | POA: Diagnosis not present

## 2021-05-13 DIAGNOSIS — L709 Acne, unspecified: Secondary | ICD-10-CM | POA: Diagnosis not present

## 2021-05-13 DIAGNOSIS — R5383 Other fatigue: Secondary | ICD-10-CM | POA: Diagnosis not present

## 2021-05-21 DIAGNOSIS — F419 Anxiety disorder, unspecified: Secondary | ICD-10-CM | POA: Diagnosis not present

## 2021-06-04 DIAGNOSIS — F419 Anxiety disorder, unspecified: Secondary | ICD-10-CM | POA: Diagnosis not present

## 2021-06-05 ENCOUNTER — Encounter: Payer: Self-pay | Admitting: Family Medicine

## 2021-06-05 DIAGNOSIS — R42 Dizziness and giddiness: Secondary | ICD-10-CM

## 2021-06-05 DIAGNOSIS — R253 Fasciculation: Secondary | ICD-10-CM

## 2021-06-05 DIAGNOSIS — L700 Acne vulgaris: Secondary | ICD-10-CM | POA: Diagnosis not present

## 2021-06-06 ENCOUNTER — Encounter: Payer: Self-pay | Admitting: Family Medicine

## 2021-06-06 ENCOUNTER — Ambulatory Visit (INDEPENDENT_AMBULATORY_CARE_PROVIDER_SITE_OTHER): Payer: BC Managed Care – PPO | Admitting: Family Medicine

## 2021-06-06 VITALS — BP 100/60 | HR 87 | Temp 98.2°F | Ht 66.0 in | Wt 181.7 lb

## 2021-06-06 DIAGNOSIS — R42 Dizziness and giddiness: Secondary | ICD-10-CM | POA: Diagnosis not present

## 2021-06-06 DIAGNOSIS — E538 Deficiency of other specified B group vitamins: Secondary | ICD-10-CM | POA: Diagnosis not present

## 2021-06-06 DIAGNOSIS — E559 Vitamin D deficiency, unspecified: Secondary | ICD-10-CM | POA: Diagnosis not present

## 2021-06-06 LAB — VITAMIN D 25 HYDROXY (VIT D DEFICIENCY, FRACTURES): VITD: 33.16 ng/mL (ref 30.00–100.00)

## 2021-06-06 LAB — VITAMIN B12: Vitamin B-12: 908 pg/mL (ref 211–911)

## 2021-06-06 LAB — FOLATE: Folate: >23.4 ng/mL (ref >5.9)

## 2021-06-06 NOTE — Progress Notes (Signed)
Melissa Daniel DOB: 23-Jul-2000 Encounter date: 06/06/2021  This is a 20 y.o. female who presents with Chief Complaint  Patient presents with   Dizziness   Fatigue    History of present illness: Last visit with me was 01/20/2021. Patient sent chart note this week and was told to set up visit:   Dr. Ethlyn Gallery, When I reached the seven-week mark of taking spironolactone, I started feeling dizzy, experiencing vertigo, trouble balancing, lightheadedness, twitching, fatigue, muscle cramps, brain fog, feeling extra hungry/thirsty, and slightly vision. Since my OB/GYN was prescribing the spironolactone, I went to her office on 11/22 to get my blood pressure checked and blood work done. She tested me for the following things: -Blood pressure check -CBC w/ auto diff- all normal; reviewed through patient's portal.  -CMP, serum or plasma Glucose 89, BUN 11, creat 0.76, GFR 114, sodium 140, K 4.1, CO2 24, ca 9.4, protein 7.1, albumin 4.7, bili 0.5, alk phos 108, AST 21, ALT 20,   -HbA1c, blood (5.4) - same as our last check 4 months ago.  -TSH, ultra-sensitive, serum - 1.91 -Magnesium, serum or plasma - 1.8 -Vitamin D levels (patient states that she was told she was tested, but can't see it on portal) Every single test came back normal, including my blood pressure. I stopped taking the spironolactone 2-3 weeks ago, and was starting to feel normal again. However, I began experiencing the same symptoms again a Daniel days ago, and they are still occurring. The dizziness, twitching, and fatigue have been the most bothersome. I am starting to think that it was never the medicine causing these symptoms, and that something else is wrong. I had covid in September and the flu in October, and my mom thinks that I am having long-term effects from one or the other.  What do you suggest I do from here?   Had hectic semester, not sleeping as well as she should. Course load was hardest it has been. She got COVID in  September and then flu a month and a half later, then cold. Just felt like body couldn't get break to recover. Took a couple of weeks to recover from both; got behind  on work. Semester ended a Daniel days ago and grades were good.   Mom had covid in June and it took her a couple months to recover, so she thought that maybe her sx were related.    Of note, on our bloodwork in august: B12 was quite deficient. She has been doing the sublingual B12 and has generally been pretty good about taking this regularly. No skin changes, no changes in taste/smell.   Went to dermatology yesterday. Acne has been acting up. Since stopping spironolactone they are putting her back on doxycycline.   Since getting IUD her headaches have been better than they used to be. Has lost 15 lbs since getting IUD and hasn't been strenuously exercising. She is happy with this. If she gets headache it is at night and she will wake up and they are gone.   Has psychiatry appointment next month.   Eating and staying hydrated ok. When dizziness first occurred it felt like she was hungry no matter how much she was drinking or eating. Usually just having 1-2 meals per day as she tends to sleep in later in the morning.   Notes the dizziness more with standing, but notes it somewhat with sitting. If she is concentrating on something (like watching TV or shopping) then she doesn't notice it  as much. Sometimes with walking feels like it is harder to balance. Has pretty much been there all the time.   No ear pain. Wonders about it being Chief Strategy Officer. After covid did have some ear issues - sudafed helped with this; just took a Daniel days to go away. Does feel that head movements make dizziness worse. Not blurry vision but harder for eyes to focus. Was extremely thirsty on spironolactone, but does tend to stay a little thirsty. Sometimes does note ringing in ears. Sometimes notes clogging and then opening back up. Does still have flonase, but hasn't  been using daily.   Was on menses when iud was inserted. Had 2 after that; but then last was in October (was short - 3 days) and none since then.   Bowels are working ok.   Tries not to consume a lot of caffeine - makes her jittery.    No Known Allergies Current Meds  Medication Sig   azelastine (ASTELIN) 0.1 % nasal spray Place 1-2 sprays into both nostrils 2 (two) times daily.   buPROPion (WELLBUTRIN XL) 150 MG 24 hr tablet Take 150 mg by mouth daily.   Dapsone 5 % topical gel    diphenhydrAMINE (BENADRYL) 25 mg capsule    escitalopram (LEXAPRO) 5 MG tablet Take 5 mg by mouth daily.   fluticasone (FLONASE) 50 MCG/ACT nasal spray SHAKE LIQUID AND USE 1 SPRAY IN EACH NOSTRIL DAILY   hydrOXYzine (VISTARIL) 25 MG capsule Take 25 mg by mouth 2 (two) times daily as needed.   ibuprofen (ADVIL,MOTRIN) 200 MG tablet Take 200 mg by mouth as needed. Reported on 01/03/2016   levonorgestrel (MIRENA) 20 MCG/DAY IUD 1 each by Intrauterine route once.   Omega-3 Fatty Acids (FISH OIL PO) Take by mouth daily.   omeprazole (PRILOSEC) 20 MG capsule Take 20 mg by mouth daily.   polycarbophil (FIBERCON) 625 MG tablet Take 625 mg by mouth daily.   PREVIDENT 5000 BOOSTER PLUS 1.1 % PSTE BRUSH WITH PEA-SIZED AMT BID AND SPIT. DO NOT EAT OR DRINK FOR 30 MIN AFTER   tretinoin (RETIN-A) 0.05 % cream     Review of Systems  Constitutional:  Negative for chills, fatigue and fever.  Respiratory:  Negative for cough, chest tightness, shortness of breath and wheezing.   Cardiovascular:  Negative for chest pain, palpitations and leg swelling.  Gastrointestinal:  Negative for abdominal distention and abdominal pain.  Genitourinary:  Negative for dysuria and frequency.  Musculoskeletal:  Negative for arthralgias.  Skin:  Negative for rash.  Neurological:  Negative for dizziness and light-headedness (more off balance feeling).   Objective:  BP 100/60 (BP Location: Left Arm, Patient Position: Sitting, Cuff Size:  Large)    Pulse 87    Temp 98.2 F (36.8 C) (Oral)    Ht '5\' 6"'  (1.676 m)    Wt 181 lb 11.2 oz (82.4 kg)    SpO2 99%    BMI 29.33 kg/m   Weight: 181 lb 11.2 oz (82.4 kg)   BP Readings from Last 3 Encounters:  06/06/21 100/60  01/21/21 110/72  01/20/21 122/80   Wt Readings from Last 3 Encounters:  06/06/21 181 lb 11.2 oz (82.4 kg)  01/21/21 192 lb (87.1 kg)  01/20/21 191 lb 9.6 oz (86.9 kg)    Physical Exam Constitutional:      General: She is not in acute distress.    Appearance: She is well-developed.  Cardiovascular:     Rate and Rhythm: Normal rate and regular  rhythm.     Heart sounds: Normal heart sounds. No murmur heard.   No friction rub.  Pulmonary:     Effort: Pulmonary effort is normal. No respiratory distress.     Breath sounds: Normal breath sounds. No wheezing or rales.  Musculoskeletal:     Right lower leg: No edema.     Left lower leg: No edema.  Neurological:     Mental Status: She is alert and oriented to person, place, and time.     Motor: Motor function is intact.     Gait: Gait is intact.  Psychiatric:        Behavior: Behavior normal.    Assessment/Plan  1. B12 deficiency We will recheck levels.  This is something that could contribute to some of her current symptoms. - Vitamin B12; Future - Folate; Future - Folate - Vitamin B12  2. Dizziness  Uncertain etiology.  Patient does not feel this is related to taking the patient.  Continue to monitor symptoms.  She is not orthostatic (checked in the office today).  Consider further evaluation pending blood work results.  She has had recent (multiple) viral illness, she has been losing weight off of her birth control pills, and she has been under increased stress.  Any of these may contribute to her symptoms. Further evaluation pending lab results. - Zinc; Future - Zinc  3. Vitamin D insufficiency - VITAMIN D 25 Hydroxy (Vit-D Deficiency, Fractures); Future - VITAMIN D 25 Hydroxy (Vit-D Deficiency,  Fractures)   Return for pending bloodwork.     Micheline Rough, MD

## 2021-06-06 NOTE — Patient Instructions (Signed)
I would suggest flonase daily.    Benign Positional Vertigo  Vertigo is the feeling that you or your surroundings are moving when they are not. Benign positional vertigo is the most common form of vertigo. The cause of this condition is not serious (is benign). This condition is triggered by certain movements and positions (is positional). This condition can be dangerous if it occurs while you are doing something that could endanger you or others, such as driving. What are the causes? In many cases, the cause of this condition is not known. It may be caused by a disturbance in an area of the inner ear that helps your brain to sense movement and balance. This disturbance can be caused by a viral infection (labyrinthitis), head injury, or repetitive motion. What increases the risk? This condition is more likely to develop in: Women. People who are 52 years of age or older.   What are the signs or symptoms? Symptoms of this condition usually happen when you move your head or your eyes in different directions. Symptoms may start suddenly, and they usually last for less than a minute. Symptoms may include: Loss of balance and falling. Feeling like you are spinning or moving. Feeling like your surroundings are spinning or moving. Nausea and vomiting. Blurred vision. Dizziness. Involuntary eye movement (nystagmus).   Symptoms can be mild and cause only slight annoyance, or they can be severe and interfere with daily life. Episodes of benign positional vertigo may return (recur) over time, and they may be triggered by certain movements. Symptoms may improve over time. How is this diagnosed? This condition is usually diagnosed by medical history and a physical exam of the head, neck, and ears. You may be referred to a health care provider who specializes in ear, nose, and throat (ENT) problems (otolaryngologist) or a provider who specializes in disorders of the nervous system (neurologist). You may  have additional testing, including: MRI. A CT scan. Eye movement tests. Your health care provider may ask you to change positions quickly while he or she watches you for symptoms of benign positional vertigo, such as nystagmus. Eye movement may be tested with an electronystagmogram (ENG), caloric stimulation, the Dix-Hallpike test, or the roll test. An electroencephalogram (EEG). This records electrical activity in your brain. Hearing tests.   How is this treated? Usually, your health care provider will treat this by moving your head in specific positions to adjust your inner ear back to normal. Surgery may be needed in severe cases, but this is rare. In some cases, benign positional vertigo may resolve on its own in 2-4 weeks. Follow these instructions at home: Safety Move slowly.Avoid sudden body or head movements. Avoid driving. Avoid operating heavy machinery. Avoid doing any tasks that would be dangerous to you or others if a vertigo episode would occur. If you have trouble walking or keeping your balance, try using a cane for stability. If you feel dizzy or unstable, sit down right away. Return to your normal activities as told by your health care provider. Ask your health care provider what activities are safe for you. General instructions Take over-the-counter and prescription medicines only as told by your health care provider. Avoid certain positions or movements as told by your health care provider. Drink enough fluid to keep your urine clear or pale yellow. Keep all follow-up visits as told by your health care provider. This is important. Contact a health care provider if: You have a fever. Your condition gets worse or you  develop new symptoms. Your family or friends notice any behavioral changes. Your nausea or vomiting gets worse. You have numbness or a pins and needles sensation. Get help right away if: You have difficulty speaking or moving. You are always dizzy. You  faint. You develop severe headaches. You have weakness in your legs or arms. You have changes in your hearing or vision. You develop a stiff neck. You develop sensitivity to light. This information is not intended to replace advice given to you by your health care provider. Make sure you discuss any questions you have with your health care provider. Document Released: 03/16/2006 Document Revised: 11/14/2015 Document Reviewed: 10/01/2014 Elsevier Interactive Patient Education  2018 [employer].    How to Perform the Epley Maneuver  *Alpha Gula, MD vertigo description and maneuver  (you can also look at half somersault maneuver and/or [facility] has a nice youtube video for epley maneuver)  The Epley maneuver is an exercise that relieves symptoms of vertigo. Vertigo is the feeling that you or your surroundings are moving when they are not. When you feel vertigo, you may feel like the room is spinning and have trouble walking. Dizziness is a little different than vertigo. When you are dizzy, you may feel unsteady or light-headed. You can do this maneuver at home whenever you have symptoms of vertigo. You can do it up to 3 times a day until your symptoms go away. Even though the Epley maneuver may relieve your vertigo for a few weeks, it is possible that your symptoms will return. This maneuver relieves vertigo, but it does not relieve dizziness. What are the risks? If it is done correctly, the Epley maneuver is considered safe. Sometimes it can lead to dizziness or nausea that goes away after a short time. If you develop other symptoms, such as changes in vision, weakness, or numbness, stop doing the maneuver and call your health care provider. How to perform the Epley maneuver Sit on the edge of a bed or table with your back straight and your legs extended or hanging over the edge of the bed or table. Turn your head halfway toward the affected ear or side. Lie backward quickly with  your head turned until you are lying flat on your back. You may want to position a pillow under your shoulders. Hold this position for 30 seconds. You may experience an attack of vertigo. This is normal. Turn your head to the opposite direction until your unaffected ear is facing the floor. Hold this position for 30 seconds. You may experience an attack of vertigo. This is normal. Hold this position until the vertigo stops. Turn your whole body to the same side as your head. Hold for another 30 seconds. Sit back up. You can repeat this exercise up to 3 times a day. Follow these instructions at home: After doing the Epley maneuver, you can return to your normal activities. Ask your health care provider if there is anything you should do at home to prevent vertigo. He or she may recommend that you: Keep your head raised (elevated) with two or more pillows while you sleep. Do not sleep on the side of your affected ear. Get up slowly from bed. Avoid sudden movements during the day. Avoid extreme head movement, like looking up or bending over. Contact a health care provider if: Your vertigo gets worse. You have other symptoms, including: Nausea. Vomiting. Headache. Get help right away if: You have vision changes. You have a severe or  worsening headache or neck pain. You cannot stop vomiting. You have new numbness or weakness in any part of your body. Summary Vertigo is the feeling that you or your surroundings are moving when they are not. The Epley maneuver is an exercise that relieves symptoms of vertigo. If the Epley maneuver is done correctly, it is considered safe. You can do it up to 3 times a day. This information is not intended to replace advice given to you by your health care provider. Make sure you discuss any questions you have with your health care provider. Document Released: 06/13/2013 Document Revised: 04/28/2016 Document Reviewed: 04/28/2016 Elsevier Interactive Patient  Education  2017 [employer].

## 2021-06-10 LAB — ZINC: Zinc: 83 ug/dL (ref 60–130)

## 2021-06-18 DIAGNOSIS — H1045 Other chronic allergic conjunctivitis: Secondary | ICD-10-CM | POA: Diagnosis not present

## 2021-06-18 DIAGNOSIS — J3089 Other allergic rhinitis: Secondary | ICD-10-CM | POA: Diagnosis not present

## 2021-06-25 DIAGNOSIS — F419 Anxiety disorder, unspecified: Secondary | ICD-10-CM | POA: Diagnosis not present

## 2021-07-09 ENCOUNTER — Encounter: Payer: Self-pay | Admitting: Neurology

## 2021-07-16 DIAGNOSIS — F419 Anxiety disorder, unspecified: Secondary | ICD-10-CM | POA: Diagnosis not present

## 2021-07-18 ENCOUNTER — Ambulatory Visit (INDEPENDENT_AMBULATORY_CARE_PROVIDER_SITE_OTHER): Payer: BC Managed Care – PPO | Admitting: Neurology

## 2021-07-18 ENCOUNTER — Other Ambulatory Visit: Payer: Self-pay

## 2021-07-18 ENCOUNTER — Encounter: Payer: Self-pay | Admitting: Neurology

## 2021-07-18 VITALS — BP 123/68 | HR 92 | Ht 66.0 in | Wt 186.0 lb

## 2021-07-18 DIAGNOSIS — R253 Fasciculation: Secondary | ICD-10-CM

## 2021-07-18 DIAGNOSIS — R42 Dizziness and giddiness: Secondary | ICD-10-CM | POA: Diagnosis not present

## 2021-07-18 NOTE — Patient Instructions (Signed)
MRI brain will be ordered. We will contact you with the results.

## 2021-07-18 NOTE — Progress Notes (Signed)
[facility] Clinic Note - Initial Visit   Date: 07/18/21  Melissa Daniel MRN: [id number] DOB: 2000/07/09   Dear Dr. Hassan Rowan:  Thank you for your kind referral of Melissa Daniel for consultation of muscle twitches and lightheadedness. Although her history is well known to you, please allow [place] to reiterate it for the purpose of our medical record. The patient was accompanied to the clinic by mother who also provides collateral information.     History of Present Illness: Melissa Daniel is a 21 y.o. left-handed female with anxiety/depression presenting for evaluation of muscle twitches and lightheadedness.   A week before Thanksgiving, she began having muscle twitches and dizziness .  She was taking spironolactone for acne and took this about 7 weeks.  She stopped the medication, thinking it was medication side effect and noticed that although it did not resolve, it may have lessened.  She has twitches in the shoulder and arms.  No specific triggers.  No weakness, numbness/tingling. Dizziness is intermittent and improved when she is distracted.  It is more noticeable when sitting still.  She endorses worrying about things and is being treated with Lexapro and Wellbutrin for anxiety/depression.    Out-side paper records, electronic medical record, and images have been reviewed where available and summarized as:  Lab Results  Component Value Date   HGBA1C 5.4 01/20/2021   Lab Results  Component Value Date   VITAMINB12 908 06/06/2021   Lab Results  Component Value Date   TSH 1.47 01/20/2021    Past Medical History:  Diagnosis Date   Anxiety    Central auditory processing disorder    audiological, OT, full psych eval in 2008-2009   Depression    Idiopathic scoliosis    mild   Problems with learning    CAP, has tutor    Past Surgical History:  Procedure Laterality Date   ADENOIDECTOMY     TYMPANOSTOMY TUBE PLACEMENT  2003   WISDOM TOOTH  EXTRACTION       Medications:  Outpatient Encounter Medications as of 07/18/2021  Medication Sig   azelastine (ASTELIN) 0.1 % nasal spray Place 1-2 sprays into both nostrils 2 (two) times daily.   buPROPion (WELLBUTRIN XL) 150 MG 24 hr tablet Take 150 mg by mouth daily.   Dapsone 5 % topical gel    diphenhydrAMINE (BENADRYL) 25 mg capsule    doxycycline (VIBRA-TABS) 100 MG tablet Take 100 mg by mouth 2 (two) times daily.   escitalopram (LEXAPRO) 5 MG tablet Take 5 mg by mouth daily.   fluticasone (FLONASE) 50 MCG/ACT nasal spray SHAKE LIQUID AND USE 1 SPRAY IN EACH NOSTRIL DAILY   hydrOXYzine (VISTARIL) 25 MG capsule Take 25 mg by mouth 2 (two) times daily as needed.   ibuprofen (ADVIL,MOTRIN) 200 MG tablet Take 200 mg by mouth as needed. Reported on 01/03/2016   levonorgestrel (MIRENA) 20 MCG/DAY IUD 1 each by Intrauterine route once.   Omega-3 Fatty Acids (FISH OIL PO) Take by mouth daily.   omeprazole (PRILOSEC) 20 MG capsule Take 20 mg by mouth daily.   polycarbophil (FIBERCON) 625 MG tablet Take 625 mg by mouth daily.   PREVIDENT 5000 BOOSTER PLUS 1.1 % PSTE BRUSH WITH PEA-SIZED AMT BID AND SPIT. DO NOT EAT OR DRINK FOR 30 MIN AFTER   tretinoin (RETIN-A) 0.05 % cream    No facility-administered encounter medications on file as of 07/18/2021.    Allergies: No Known Allergies  Family History: Family History  Problem Relation Age of Onset   Hypertension Father    Liver disease Father    Seizures Father    Ankylosing spondylitis Father    Lupus Paternal Grandmother    Alzheimer's disease Maternal Grandmother    Pancreatic cancer Neg Hx    Stomach cancer Neg Hx    Esophageal cancer Neg Hx    Colon cancer Neg Hx    Inflammatory bowel disease Neg Hx    Rectal cancer Neg Hx     Social History: Social History   Tobacco Use   Smoking status: Never   Smokeless tobacco: Never  Vaping Use   Vaping Use: Former  Substance Use Topics   Alcohol use: Yes    Comment: rare 1  drink   Drug use: No   Social History   Social History Narrative   Lives with parents and sister   Father has cirrhosis -- developed in past year. 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  Speech is not dysarthric.  CRANIAL NERVES: II:  No visual field defects.  III-IV-VI: Pupils equal round and reactive to light.  Normal conjugate, extra-ocular eye movements in all directions of gaze.  No nystagmus.  No ptosis.   V:  Normal facial sensation.    VII:  Normal facial symmetry and movements.   VIII:  Normal hearing and vestibular function.   IX-X:  Normal palatal movement.   XI:  Normal shoulder shrug and head rotation.   XII:  Normal tongue strength and range of motion, no deviation or fasciculation.  MOTOR: Motor strength is 5/5 throughout.  No atrophy, fasciculations or abnormal movements.  No pronator drift.   MSRs:  Right        Left                  brachioradialis 2+  2+  biceps 2+  2+  triceps 2+  2+  patellar 2+  2+  ankle jerk 2+  2+  Hoffman no  no  plantar response down  down   SENSORY:  Normal and symmetric perception of light touch, pinprick, vibration.  Romberg's sign absent.    COORDINATION/GAIT: Normal finger-to- nose-finger.  Intact rapid alternating movements bilaterally.  Gait narrow based and stable. Tandem and stressed gait intact.    IMPRESSION: Muscle twitches and lightheadedness. Neurological exam is normal, which is reassuring.  Discussed that symptoms could be possible medication effects  vs. Stress/anxiety-induced.  TSH and B12 is normal. To be sure there is no intracranial pathology, such as demyelinating disease, I will check MRI brain wwo contrast.    Further recommendations pending results.    Thank you for allowing me to participate in patient's care.  If I can answer any additional questions, I would be pleased to do so.    Sincerely,    Nayquan Evinger K. Allena KatzPatel, DO

## 2021-07-30 DIAGNOSIS — F419 Anxiety disorder, unspecified: Secondary | ICD-10-CM | POA: Diagnosis not present

## 2021-08-06 ENCOUNTER — Encounter: Payer: Self-pay | Admitting: Family Medicine

## 2021-08-10 ENCOUNTER — Other Ambulatory Visit: Payer: Self-pay

## 2021-08-10 ENCOUNTER — Ambulatory Visit
Admission: RE | Admit: 2021-08-10 | Discharge: 2021-08-10 | Disposition: A | Discharge status: Home / Self Care | Payer: BC Managed Care – PPO | Source: Ambulatory Visit | Attending: Neurology | Admitting: Neurology

## 2021-08-10 DIAGNOSIS — R253 Fasciculation: Secondary | ICD-10-CM

## 2021-08-10 DIAGNOSIS — R42 Dizziness and giddiness: Secondary | ICD-10-CM | POA: Diagnosis not present

## 2021-08-10 MED ORDER — GADOBENATE DIMEGLUMINE 529 MG/ML IV SOLN
15.0000 mL | Freq: Once | INTRAVENOUS | Status: AC | PRN
  Administered 2021-08-10: 15 mL via INTRAVENOUS

## 2021-08-11 ENCOUNTER — Telehealth: Payer: Self-pay | Admitting: Neurology

## 2021-08-11 DIAGNOSIS — Z30432 Encounter for removal of intrauterine contraceptive device: Secondary | ICD-10-CM | POA: Diagnosis not present

## 2021-08-11 DIAGNOSIS — Z309 Encounter for contraceptive management, unspecified: Secondary | ICD-10-CM | POA: Diagnosis not present

## 2021-08-11 NOTE — Telephone Encounter (Signed)
Please let pt/parent's know that I have personally viewed her MRI brain and do not see anything worrisome causing her symptoms.    She has very mild nonspecific white matter changes, which is very common finding and does not indicate anything worrisome.    Specifically, there is no findings to suggests multiple sclerosis, stroke, or mass.

## 2021-08-11 NOTE — Telephone Encounter (Signed)
Called patients father and informed him of MRI results per Dr. Allena Katz. Patients father asked if I could call patient to inform her.   Called patient and informed her as well of her MRI results per Dr. Allena Katz. Patient verbalized understanding and had no further questions or concerns.

## 2021-08-11 NOTE — Telephone Encounter (Signed)
Patients dad called and stated they had seen results of Nans MRI and may be a little concerned.  He asked if he or Steward Drone could be called back about them, Bonnita Nasuti is back at school and she is by herself.

## 2021-08-18 ENCOUNTER — Encounter: Payer: Self-pay | Admitting: Neurology

## 2021-08-21 ENCOUNTER — Other Ambulatory Visit: Payer: Self-pay

## 2021-08-21 DIAGNOSIS — F419 Anxiety disorder, unspecified: Secondary | ICD-10-CM | POA: Diagnosis not present

## 2021-08-21 DIAGNOSIS — R253 Fasciculation: Secondary | ICD-10-CM

## 2021-08-25 ENCOUNTER — Other Ambulatory Visit: Payer: Self-pay

## 2021-08-25 DIAGNOSIS — R42 Dizziness and giddiness: Secondary | ICD-10-CM

## 2021-08-25 DIAGNOSIS — R202 Paresthesia of skin: Secondary | ICD-10-CM

## 2021-08-26 ENCOUNTER — Encounter: Payer: Self-pay | Admitting: Family Medicine

## 2021-09-01 ENCOUNTER — Ambulatory Visit: Payer: BC Managed Care – PPO | Admitting: Neurology

## 2021-09-02 ENCOUNTER — Other Ambulatory Visit: Payer: Self-pay

## 2021-09-02 ENCOUNTER — Ambulatory Visit (INDEPENDENT_AMBULATORY_CARE_PROVIDER_SITE_OTHER): Payer: BC Managed Care – PPO | Admitting: Neurology

## 2021-09-02 DIAGNOSIS — R253 Fasciculation: Secondary | ICD-10-CM

## 2021-09-02 DIAGNOSIS — R202 Paresthesia of skin: Secondary | ICD-10-CM | POA: Diagnosis not present

## 2021-09-02 DIAGNOSIS — L700 Acne vulgaris: Secondary | ICD-10-CM | POA: Diagnosis not present

## 2021-09-02 NOTE — Procedures (Signed)
[facility]  ?[address], [address] ? [place], [place] [address] ?Tel: [phone] ?Fax:  [phone] ?Test Date:  09/02/2021 ? ?Patient: Melissa Daniel DOB: 2000/10/30 Physician: Nita Sickle, DO  ?Sex: Female Height: 5\' 6"  Ref Phys: , DO  ?ID#: Nita Sickle   Technician:   ? ?Patient Complaints: ?This is a 21 year old female referred for evaluation of generalized muscle twitches involving the arms. ? ?NCV & EMG Findings: ?Extensive electrodiagnostic testing of the right upper extremity and additional studies of the left shows: ?Bilateral median, ulnar, and mixed palmar sensory responses are within normal limits. ?Bilateral median and ulnar motor responses are within normal limits. ?There is no evidence of active or chronic motor axonal loss changes affecting any of the tested muscles.  Motor unit configuration and recruitment pattern is within normal limits. ? ? ?Impression: ?This is a normal study of the upper extremities.  In particular, there is no evidence of carpal tunnel syndrome or a cervical radiculopathy. ? ? ?___________________________ ?36, DO ? ? ? ?Nerve Conduction Studies ?Anti Sensory Summary Table ? ? Stim Site NR Peak (ms) Norm Peak (ms) P-T Amp (?V) Norm P-T Amp  ?Left Median Anti Sensory (2nd Digit)  33?C  ?Wrist    2.7 <3.3 60.2 >20  ?Right Median Anti Sensory (2nd Digit)  33?C  ?Wrist    2.5 <3.3 60.2 >20  ?Left Ulnar Anti Sensory (5th Digit)  33?C  ?Wrist    2.5 <3.0 50.3 >18  ?Right Ulnar Anti Sensory (5th Digit)  33?C  ?Wrist    2.4 <3.0 50.4 >18  ? ?Motor Summary Table ? ? Stim Site NR Onset (ms) Norm Onset (ms) O-P Amp (mV) Norm O-P Amp Site1 Site2 Delta-0 (ms) Dist (cm) Vel (m/s) Norm Vel (m/s)  ?Left Median Motor (Abd Poll Brev)  33?C  ?Wrist    3.0 <3.9 12.1 >6 Elbow Wrist 4.9 29.0 59 >51  ?Elbow    7.9  11.7         ?Right Median Motor (Abd Poll Brev)  33?C  ?Wrist    2.8 <3.9 13.0 >6 Elbow Wrist 4.9 30.0 61 >51  ?Elbow    7.7  12.9         ?Left  Ulnar Motor (Abd Dig Minimi)  33?C  ?Wrist    2.4 <3.0 13.4 >8 B Elbow Wrist 3.5 21.0 60 >51  ?B Elbow    5.9  12.8  A Elbow B Elbow 1.6 10.0 63 >51  ?A Elbow    7.5  12.4         ?Right Ulnar Motor (Abd Dig Minimi)  33?C  ?Wrist    2.2 <3.0 12.4 >8 B Elbow Wrist 3.7 22.0 59 >51  ?B Elbow    5.9  11.7  A Elbow B Elbow 1.7 10.0 59 >51  ?A Elbow    7.6  11.4         ? ?Comparison Summary Table ? ? Stim Site NR Peak (ms) Norm Peak (ms) P-T Amp (?V) Site1 Site2 Delta-P (ms) Norm Delta (ms)  ?Left Median/Ulnar Palm Comparison (Wrist - 8cm)  33?C  ?Median Palm    1.5 <2.2 152.9 Median Palm Ulnar Palm 0.3   ?Ulnar Palm    1.2 <2.2 31.4      ?Right Median/Ulnar Palm Comparison (Wrist - 8cm)  33?C  ?Median Palm    1.4 <2.2 158.8 Median Palm Ulnar Palm 0.1   ?Ulnar Palm    1.3 <2.2 25.7      ? ?  EMG ? ? Side Muscle Ins Act Fibs Psw Fasc Number Recrt Dur Dur. Amp Amp. Poly Poly. Comment  ?Right 1stDorInt Nml Nml Nml Nml Nml Nml Nml Nml Nml Nml Nml Nml N/A  ?Right PronatorTeres Nml Nml Nml Nml Nml Nml Nml Nml Nml Nml Nml Nml N/A  ?Right Biceps Nml Nml Nml Nml Nml Nml Nml Nml Nml Nml Nml Nml N/A  ?Right Triceps Nml Nml Nml Nml Nml Nml Nml Nml Nml Nml Nml Nml N/A  ?Right Deltoid Nml Nml Nml Nml Nml Nml Nml Nml Nml Nml Nml Nml N/A  ?Right Infraspinatus Nml Nml Nml Nml Nml Nml Nml Nml Nml Nml Nml Nml N/A  ?Left Infraspinatus Nml Nml Nml Nml Nml Nml Nml Nml Nml Nml Nml Nml N/A  ?Left 1stDorInt Nml Nml Nml Nml Nml Nml Nml Nml Nml Nml Nml Nml N/A  ?Left PronatorTeres Nml Nml Nml Nml Nml Nml Nml Nml Nml Nml Nml Nml N/A  ?Left Biceps Nml Nml Nml Nml Nml Nml Nml Nml Nml Nml Nml Nml N/A  ?Left Triceps Nml Nml Nml Nml Nml Nml Nml Nml Nml Nml Nml Nml N/A  ?Left Deltoid Nml Nml Nml Nml Nml Nml Nml Nml Nml Nml Nml Nml N/A  ? ? ? ? ?Waveforms: ?    ? ?    ? ?    ? ?  ? ? ?

## 2021-09-04 DIAGNOSIS — M62830 Muscle spasm of back: Secondary | ICD-10-CM | POA: Diagnosis not present

## 2021-09-04 DIAGNOSIS — M9903 Segmental and somatic dysfunction of lumbar region: Secondary | ICD-10-CM | POA: Diagnosis not present

## 2021-09-04 DIAGNOSIS — M9905 Segmental and somatic dysfunction of pelvic region: Secondary | ICD-10-CM | POA: Diagnosis not present

## 2021-09-04 DIAGNOSIS — M9902 Segmental and somatic dysfunction of thoracic region: Secondary | ICD-10-CM | POA: Diagnosis not present

## 2021-09-04 DIAGNOSIS — M9901 Segmental and somatic dysfunction of cervical region: Secondary | ICD-10-CM | POA: Diagnosis not present

## 2021-09-04 DIAGNOSIS — M531 Cervicobrachial syndrome: Secondary | ICD-10-CM | POA: Diagnosis not present

## 2021-09-10 DIAGNOSIS — F419 Anxiety disorder, unspecified: Secondary | ICD-10-CM | POA: Diagnosis not present

## 2021-09-25 ENCOUNTER — Encounter: Payer: BC Managed Care – PPO | Admitting: Neurology

## 2021-10-08 DIAGNOSIS — F419 Anxiety disorder, unspecified: Secondary | ICD-10-CM | POA: Diagnosis not present

## 2021-10-09 ENCOUNTER — Encounter: Payer: Self-pay | Admitting: Family Medicine

## 2021-10-30 ENCOUNTER — Encounter: Payer: Self-pay | Admitting: Neurology

## 2021-11-03 DIAGNOSIS — R42 Dizziness and giddiness: Secondary | ICD-10-CM | POA: Diagnosis not present

## 2021-11-03 DIAGNOSIS — H93293 Other abnormal auditory perceptions, bilateral: Secondary | ICD-10-CM | POA: Diagnosis not present

## 2021-11-05 DIAGNOSIS — M791 Myalgia, unspecified site: Secondary | ICD-10-CM | POA: Diagnosis not present

## 2021-11-05 DIAGNOSIS — R0981 Nasal congestion: Secondary | ICD-10-CM | POA: Diagnosis not present

## 2021-11-05 DIAGNOSIS — R070 Pain in throat: Secondary | ICD-10-CM | POA: Diagnosis not present

## 2021-11-08 ENCOUNTER — Telehealth: Payer: BC Managed Care – PPO | Admitting: Family

## 2021-11-08 DIAGNOSIS — J069 Acute upper respiratory infection, unspecified: Secondary | ICD-10-CM

## 2021-11-09 MED ORDER — BENZONATATE 100 MG PO CAPS: 100.0000 mg | ORAL_CAPSULE | Freq: Three times a day (TID) | ORAL | 0 refills | Status: DC | PRN

## 2021-11-09 MED ORDER — CETIRIZINE HCL 10 MG PO TABS: 10.0000 mg | ORAL_TABLET | Freq: Every day | ORAL | 1 refills | Status: DC

## 2021-11-09 MED ORDER — FLUTICASONE PROPIONATE 50 MCG/ACT NA SUSP: 2.0000 | Freq: Every day | NASAL | 6 refills | Status: DC

## 2021-11-09 NOTE — Progress Notes (Signed)

## 2021-11-10 ENCOUNTER — Telehealth: Payer: BC Managed Care – PPO | Admitting: Nurse Practitioner

## 2021-11-10 DIAGNOSIS — H1032 Unspecified acute conjunctivitis, left eye: Secondary | ICD-10-CM | POA: Diagnosis not present

## 2021-11-10 DIAGNOSIS — J019 Acute sinusitis, unspecified: Secondary | ICD-10-CM | POA: Diagnosis not present

## 2021-11-10 DIAGNOSIS — J0140 Acute pansinusitis, unspecified: Secondary | ICD-10-CM

## 2021-11-11 DIAGNOSIS — F419 Anxiety disorder, unspecified: Secondary | ICD-10-CM | POA: Diagnosis not present

## 2021-11-11 MED ORDER — AMOXICILLIN-POT CLAVULANATE 875-125 MG PO TABS: 1.0000 | ORAL_TABLET | Freq: Two times a day (BID) | ORAL | 0 refills | Status: AC

## 2021-11-11 NOTE — Progress Notes (Signed)
E-Visit for Sinus Problems  We are sorry that you are not feeling well.  Here is how we plan to help!  Nan- I would give the prednisone and the eye drops 24-48 hours to work. The drainage from your eye is likely from your sinus. There is no good way to tell when viral upper respiratory infections become bacterial, aside from when they stop responding to over the counter medications. Continue a cold/sinus over the counter medication with the prednisone and eye drops and if symptoms do not improve in the next 24-48 hours go ahead and start an oral antibiotic and follow the instructions below. If your symptoms do improve you do not need the oral antibiotic, and it is always better to try to hold off then to take something you do not need.     Sinusitis is inflammation and infection in the sinus cavities of the head.  Based on your presentation I believe you most likely have Acute Bacterial Sinusitis.  This is an infection caused by bacteria and is treated with antibiotics. I have prescribed Augmentin 875mg /125mg  one tablet twice daily with food, for 7 days. You may use an oral decongestant such as Mucinex D or if you have glaucoma or high blood pressure use plain Mucinex. Saline nasal spray help and can safely be used as often as needed for congestion.  If you develop worsening sinus pain, fever or notice severe headache and vision changes, or if symptoms are not better after completion of antibiotic, please schedule an appointment with a health care provider.    Sinus infections are not as easily transmitted as other respiratory infection, however we still recommend that you avoid close contact with loved ones, especially the very young and elderly.  Remember to wash your hands thoroughly throughout the day as this is the number one way to prevent the spread of infection!  Home Care: Only take medications as instructed by your medical team. Complete the entire course of an antibiotic. Do not take these  medications with alcohol. A steam or ultrasonic humidifier can help congestion.  You can place a towel over your head and breathe in the steam from hot water coming from a faucet. Avoid close contacts especially the very young and the elderly. Cover your mouth when you cough or sneeze. Always remember to wash your hands.  Get Help Right Away If: You develop worsening fever or sinus pain. You develop a severe head ache or visual changes. Your symptoms persist after you have completed your treatment plan.  Make sure you Understand these instructions. Will watch your condition. Will get help right away if you are not doing well or get worse.  Thank you for choosing an e-visit.  Your e-visit answers were reviewed by a board certified advanced clinical practitioner to complete your personal care plan. Depending upon the condition, your plan could have included both over the counter or prescription medications.  Please review your pharmacy choice. Make sure the pharmacy is open so you can pick up prescription now. If there is a problem, you may contact your provider through and have the prescription routed to another pharmacy.  Your safety is important to [employer]. If you have drug allergies check your prescription carefully.   For the next 24 hours you can use MyChart to ask questions about today's visit, request a non-urgent call back, or ask for a work or school excuse. You will get an email in the next two days asking about your experience.  I hope that your e-visit has been valuable and will speed your recovery.   Meds ordered this encounter  Medications   amoxicillin-clavulanate (AUGMENTIN) 875-125 MG tablet    Sig: Take 1 tablet by mouth 2 (two) times daily for 7 days. Take with food    Dispense:  14 tablet    Refill:  0     I spent approximately 7 minutes reviewing the patient's history, current symptoms and coordinating their plan of care today.

## 2021-11-24 ENCOUNTER — Telehealth: Payer: BC Managed Care – PPO | Admitting: Physician Assistant

## 2021-11-24 DIAGNOSIS — H1030 Unspecified acute conjunctivitis, unspecified eye: Secondary | ICD-10-CM | POA: Diagnosis not present

## 2021-11-25 MED ORDER — POLYMYXIN B-TRIMETHOPRIM 10000-0.1 UNIT/ML-% OP SOLN: OPHTHALMIC | 0 refills | Status: DC

## 2021-11-25 NOTE — Progress Notes (Signed)
I have spent 5 minutes in review of e-visit questionnaire, review and updating patient chart, medical decision making and response to patient.   Meliana Canner Cody Yamira Papa, PA-C    

## 2021-11-25 NOTE — Progress Notes (Signed)
E-Visit for [employer]   We are sorry that you are not feeling well.  Here is how we plan to help!  Based on what you have shared with me it looks like you have conjunctivitis.  Conjunctivitis is a common inflammatory or infectious condition of the eye that is often referred to as "pink eye".  In most cases it is contagious (viral or bacterial). However, not all conjunctivitis requires antibiotics (ex. Allergic).  We have made appropriate suggestions for you based upon your presentation.  I have prescribed Polytrim Ophthalmic drops 1-2 drops 4 times a day times 5 days to apply to the affected eye. It could very well be that a bit of makeup got in the eye itself and caused irritation -- which can lead to secondary bacterial infection. If you did not have skin symptoms with applying the makeup then I do not think you need to stop using and since new and symptoms of other eye had resolved -- less likely to be contaminated.   Pink eye can be highly contagious.  It is typically spread through direct contact with secretions, or contaminated objects or surfaces that one may have touched.  Strict handwashing is suggested with soap and water is urged.  If not available, use alcohol based had sanitizer.  Avoid unnecessary touching of the eye.  If you wear contact lenses, you will need to refrain from wearing them until you see no white discharge from the eye for at least 24 hours after being on medication.  You should see symptom improvement in 1-2 days after starting the medication regimen.  Call us if symptoms are not improved in 1-2 days.  Home Care: Wash your hands often! Do not wear your contacts until you complete your treatment plan. Avoid sharing towels, bed linen, personal items with a person who has pink eye. See attention for anyone in your home with similar symptoms.  Get Help Right Away If: Your symptoms do not improve. You develop blurred or loss of vision. Your symptoms worsen (increased  discharge, pain or redness)   Thank you for choosing an e-visit.  Your e-visit answers were reviewed by a board certified advanced clinical practitioner to complete your personal care plan. Depending upon the condition, your plan could have included both over the counter or prescription medications.  Please review your pharmacy choice. Make sure the pharmacy is open so you can pick up prescription now. If there is a problem, you may contact your provider through [employer] and have the prescription routed to another pharmacy.  Your safety is important to [place]. If you have drug allergies check your prescription carefully.   For the next 24 hours you can use MyChart to ask questions about today's visit, request a non-urgent call back, or ask for a work or school excuse. You will get an email in the next two days asking about your experience. I hope that your e-visit has been valuable and will speed your recovery.

## 2021-11-26 DIAGNOSIS — R42 Dizziness and giddiness: Secondary | ICD-10-CM | POA: Diagnosis not present

## 2021-12-03 DIAGNOSIS — R42 Dizziness and giddiness: Secondary | ICD-10-CM | POA: Diagnosis not present

## 2021-12-15 DIAGNOSIS — F419 Anxiety disorder, unspecified: Secondary | ICD-10-CM | POA: Diagnosis not present

## 2022-02-01 ENCOUNTER — Ambulatory Visit (HOSPITAL_COMMUNITY)
Admission: RE | Admit: 2022-02-01 | Discharge: 2022-02-01 | Disposition: A | Discharge status: Home / Self Care | Payer: BC Managed Care – PPO | Source: Ambulatory Visit | Attending: Emergency Medicine | Admitting: Emergency Medicine

## 2022-02-01 ENCOUNTER — Encounter (HOSPITAL_COMMUNITY): Payer: Self-pay

## 2022-02-01 VITALS — BP 129/82 | HR 81 | Temp 98.2°F | Resp 16

## 2022-02-01 DIAGNOSIS — J029 Acute pharyngitis, unspecified: Secondary | ICD-10-CM

## 2022-02-01 DIAGNOSIS — Z113 Encounter for screening for infections with a predominantly sexual mode of transmission: Secondary | ICD-10-CM

## 2022-02-01 LAB — HIV ANTIBODY (ROUTINE TESTING W REFLEX): HIV Screen 4th Generation wRfx: NONREACTIVE

## 2022-02-01 NOTE — ED Provider Notes (Signed)
[facility]    CSN: [id number] Arrival date & time: 02/01/22  1550      History   Chief Complaint Chief Complaint  Patient presents with   appt 4    HPI Melissa Daniel is a 21 y.o. female.  Presents for STD screening. Reports possible oral STD exposure a couple weeks ago.  She has had sore throat, although unsure if this is related to upper respiratory symptoms recently.  She would like to be tested for STDs in the throat.  Also requesting vaginal swab and blood work. Denies any vaginal discharge.  No abdominal pain.  No urinary symptoms.  Last menstrual cycle 7/28  Past Medical History:  Diagnosis Date   Anxiety    Central auditory processing disorder    audiological, OT, full psych eval in 2008-2009   Depression    Idiopathic scoliosis    mild   Problems with learning    CAP, has tutor    Patient Active Problem List   Diagnosis Date Noted   Gas pain 12/10/2020   Gastroesophageal reflux disease 12/10/2020   Constipation 11/06/2020   Abdominal cramping 11/06/2020   Bloating 11/06/2020   Lower abdominal pain 11/06/2020   Nausea without vomiting 11/06/2020   Pyrosis 11/06/2020   Abnormal endocrine laboratory test finding 01/03/2016   Father with liver failure 12/22/2011   Central auditory processing disorder    Adolescent idiopathic scoliosis of thoracic region 11/26/2010   Anxiety 11/26/2010    Past Surgical History:  Procedure Laterality Date   ADENOIDECTOMY     TYMPANOSTOMY TUBE PLACEMENT  2003   WISDOM TOOTH EXTRACTION      OB History   No obstetric history on file.      Home Medications    Prior to Admission medications   Medication Sig Start Date End Date Taking? Authorizing Provider  buPROPion (WELLBUTRIN XL) 150 MG 24 hr tablet Take 150 mg by mouth daily.    [provider]  diphenhydrAMINE (BENADRYL) 25 mg capsule     [provider]  escitalopram (LEXAPRO) 5 MG tablet Take 5 mg by mouth daily. 12/11/20    [provider]  ibuprofen (ADVIL,MOTRIN) 200 MG tablet Take 200 mg by mouth as needed. Reported on 01/03/2016    [provider]  levonorgestrel (MIRENA) 20 MCG/DAY IUD 1 each by Intrauterine route once.    [provider]  Omega-3 Fatty Acids (FISH OIL PO) Take by mouth daily.    [provider]  omeprazole (PRILOSEC) 20 MG capsule Take 20 mg by mouth daily.    [provider]    Family History Family History  Problem Relation Age of Onset   Hypertension Father    Liver disease Father    Seizures Father    Ankylosing spondylitis Father    Lupus Paternal Grandmother    Alzheimer's disease Maternal Grandmother    Pancreatic cancer Neg Hx    Stomach cancer Neg Hx    Esophageal cancer Neg Hx    Colon cancer Neg Hx    Inflammatory bowel disease Neg Hx    Rectal cancer Neg Hx     Social History Social History   Tobacco Use   Smoking status: Never   Smokeless tobacco: Never  Vaping Use   Vaping Use: Former  Substance Use Topics   Alcohol use: Yes    Comment: rare 1 drink   Drug use: No     Allergies   Patient has no known allergies.  Review of Systems Review of Systems Per HPI  Physical Exam Triage Vital Signs ED Triage Vitals [02/01/22 1618]  Enc Vitals Group     BP 129/82     Pulse Rate 81     Resp 16     Temp 98.2 F (36.8 C)     Temp Source Oral     SpO2 99 %     Weight      Height      Head Circumference      Peak Flow      Pain Score 0     Pain Loc      Pain Edu?      Excl. in GC?    No data found.  Updated Vital Signs BP 129/82 (BP Location: Right Arm)   Pulse 81   Temp 98.2 F (36.8 C) (Oral)   Resp 16   LMP 01/16/2022   SpO2 99%    Physical Exam Vitals and nursing note reviewed.  Constitutional:      Appearance: Normal appearance.  HENT:     Mouth/Throat:     Pharynx: Oropharynx is clear.  Eyes:     Conjunctiva/sclera: Conjunctivae normal.  Cardiovascular:     Rate and Rhythm:  Normal rate and regular rhythm.     Pulses: Normal pulses.  Pulmonary:     Effort: Pulmonary effort is normal.  Abdominal:     General: Bowel sounds are normal.     Tenderness: There is no abdominal tenderness. There is no guarding.  Neurological:     Mental Status: She is alert and oriented to person, place, and time.      UC Treatments / Results  Labs (all labs ordered are listed, but only abnormal results are displayed) Labs Reviewed  HIV ANTIBODY (ROUTINE TESTING W REFLEX)  RPR  CYTOLOGY, (ORAL, ANAL, URETHRAL) ANCILLARY ONLY  CERVICOVAGINAL ANCILLARY ONLY    EKG  Radiology No results found.  Procedures Procedures  Medications Ordered in UC Medications - No data to display  Initial Impression / Assessment and Plan / UC Course  I have reviewed the triage vital signs and the nursing notes.  Pertinent labs & imaging results that were available during my care of the patient were reviewed by me and considered in my medical decision making (see chart for details).  Oral cytology swab pending.  Additionally cervicovaginal swab pending. RPR and HIV blood work pending.  Recommend abstain from intercourse until results return.  Provided condom baggie and STD pamphlet for patient education.  Return precautions discussed.  Patient agrees to plan  Final Clinical Impressions(s) / UC Diagnoses   Final diagnoses:  Sore throat  Screen for STD (sexually transmitted disease)     Discharge Instructions      We will call you if any of your results return positive.  Please abstain from intercourse until your results return.    ED Prescriptions   None    PDMP not reviewed this encounter.   Miasia Crabtree, Lurena Joiner, [place] 02/01/22 1708

## 2022-02-01 NOTE — ED Triage Notes (Signed)
Pt reports that had unprotected oral sex couple weeks ago and reports after wards had cold like symptoms and sore throat. Would like STD testing today esp oral testing. Reports that she had protected vaginal intercourse and asking if should get testing there as well. Informed patient that can discuss with provider and will go with their recommendations. Denies any complications or discharge

## 2022-02-01 NOTE — Discharge Instructions (Addendum)
We will call you if any of your results return positive.  Please abstain from intercourse until your results return.

## 2022-02-02 DIAGNOSIS — R42 Dizziness and giddiness: Secondary | ICD-10-CM | POA: Diagnosis not present

## 2022-02-02 DIAGNOSIS — R9082 White matter disease, unspecified: Secondary | ICD-10-CM | POA: Diagnosis not present

## 2022-02-02 LAB — CYTOLOGY, (ORAL, ANAL, URETHRAL) ANCILLARY ONLY
Chlamydia: NEGATIVE
Comment: NEGATIVE
Comment: NEGATIVE
Comment: NORMAL
Neisseria Gonorrhea: NEGATIVE
Trichomonas: NEGATIVE

## 2022-02-02 LAB — CERVICOVAGINAL ANCILLARY ONLY
Bacterial Vaginitis (gardnerella): NEGATIVE
Candida Glabrata: NEGATIVE
Candida Vaginitis: NEGATIVE
Chlamydia: NEGATIVE
Comment: NEGATIVE
Comment: NEGATIVE
Comment: NEGATIVE
Comment: NEGATIVE
Comment: NEGATIVE
Comment: NORMAL
Neisseria Gonorrhea: NEGATIVE
Trichomonas: NEGATIVE

## 2022-02-02 LAB — RPR: RPR Ser Ql: NONREACTIVE

## 2022-02-03 DIAGNOSIS — F419 Anxiety disorder, unspecified: Secondary | ICD-10-CM | POA: Diagnosis not present

## 2022-02-10 DIAGNOSIS — Z113 Encounter for screening for infections with a predominantly sexual mode of transmission: Secondary | ICD-10-CM | POA: Diagnosis not present

## 2022-02-10 DIAGNOSIS — Z01419 Encounter for gynecological examination (general) (routine) without abnormal findings: Secondary | ICD-10-CM | POA: Diagnosis not present

## 2022-02-10 DIAGNOSIS — Z1159 Encounter for screening for other viral diseases: Secondary | ICD-10-CM | POA: Diagnosis not present

## 2022-02-10 DIAGNOSIS — Z6829 Body mass index (BMI) 29.0-29.9, adult: Secondary | ICD-10-CM | POA: Diagnosis not present

## 2022-02-25 DIAGNOSIS — L680 Hirsutism: Secondary | ICD-10-CM | POA: Diagnosis not present

## 2022-02-25 DIAGNOSIS — R5383 Other fatigue: Secondary | ICD-10-CM | POA: Diagnosis not present

## 2022-02-25 DIAGNOSIS — N920 Excessive and frequent menstruation with regular cycle: Secondary | ICD-10-CM | POA: Diagnosis not present

## 2022-02-25 DIAGNOSIS — N925 Other specified irregular menstruation: Secondary | ICD-10-CM | POA: Diagnosis not present

## 2022-02-27 DIAGNOSIS — N925 Other specified irregular menstruation: Secondary | ICD-10-CM | POA: Diagnosis not present

## 2022-02-27 DIAGNOSIS — D513 Other dietary vitamin B12 deficiency anemia: Secondary | ICD-10-CM | POA: Diagnosis not present

## 2022-02-27 DIAGNOSIS — E531 Pyridoxine deficiency: Secondary | ICD-10-CM | POA: Diagnosis not present

## 2022-02-27 DIAGNOSIS — E063 Autoimmune thyroiditis: Secondary | ICD-10-CM | POA: Diagnosis not present

## 2022-02-27 DIAGNOSIS — D520 Dietary folate deficiency anemia: Secondary | ICD-10-CM | POA: Diagnosis not present

## 2022-02-27 DIAGNOSIS — E038 Other specified hypothyroidism: Secondary | ICD-10-CM | POA: Diagnosis not present

## 2022-02-27 DIAGNOSIS — R7303 Prediabetes: Secondary | ICD-10-CM | POA: Diagnosis not present

## 2022-02-27 DIAGNOSIS — E7211 Homocystinuria: Secondary | ICD-10-CM | POA: Diagnosis not present

## 2022-03-04 DIAGNOSIS — F419 Anxiety disorder, unspecified: Secondary | ICD-10-CM | POA: Diagnosis not present

## 2022-03-18 DIAGNOSIS — F419 Anxiety disorder, unspecified: Secondary | ICD-10-CM | POA: Diagnosis not present

## 2022-03-30 DIAGNOSIS — L700 Acne vulgaris: Secondary | ICD-10-CM | POA: Diagnosis not present

## 2022-04-10 DIAGNOSIS — R5383 Other fatigue: Secondary | ICD-10-CM | POA: Diagnosis not present

## 2022-04-10 DIAGNOSIS — E559 Vitamin D deficiency, unspecified: Secondary | ICD-10-CM | POA: Diagnosis not present

## 2022-04-10 DIAGNOSIS — L708 Other acne: Secondary | ICD-10-CM | POA: Diagnosis not present

## 2022-04-10 DIAGNOSIS — B2700 Gammaherpesviral mononucleosis without complication: Secondary | ICD-10-CM | POA: Diagnosis not present

## 2022-04-13 DIAGNOSIS — F419 Anxiety disorder, unspecified: Secondary | ICD-10-CM | POA: Diagnosis not present

## 2022-04-27 DIAGNOSIS — F419 Anxiety disorder, unspecified: Secondary | ICD-10-CM | POA: Diagnosis not present

## 2022-05-25 DIAGNOSIS — F419 Anxiety disorder, unspecified: Secondary | ICD-10-CM | POA: Diagnosis not present

## 2022-05-25 DIAGNOSIS — B2700 Gammaherpesviral mononucleosis without complication: Secondary | ICD-10-CM | POA: Diagnosis not present

## 2022-05-25 DIAGNOSIS — R5383 Other fatigue: Secondary | ICD-10-CM | POA: Diagnosis not present

## 2022-05-25 DIAGNOSIS — E348 Other specified endocrine disorders: Secondary | ICD-10-CM | POA: Diagnosis not present

## 2022-05-25 DIAGNOSIS — E611 Iron deficiency: Secondary | ICD-10-CM | POA: Diagnosis not present

## 2022-06-11 DIAGNOSIS — L700 Acne vulgaris: Secondary | ICD-10-CM | POA: Diagnosis not present

## 2022-06-11 DIAGNOSIS — F419 Anxiety disorder, unspecified: Secondary | ICD-10-CM | POA: Diagnosis not present

## 2022-06-23 DIAGNOSIS — H1045 Other chronic allergic conjunctivitis: Secondary | ICD-10-CM | POA: Diagnosis not present

## 2022-06-23 DIAGNOSIS — J3089 Other allergic rhinitis: Secondary | ICD-10-CM | POA: Diagnosis not present

## 2022-07-23 DIAGNOSIS — F419 Anxiety disorder, unspecified: Secondary | ICD-10-CM | POA: Diagnosis not present

## 2022-08-11 DIAGNOSIS — Z113 Encounter for screening for infections with a predominantly sexual mode of transmission: Secondary | ICD-10-CM | POA: Diagnosis not present

## 2022-08-11 DIAGNOSIS — N760 Acute vaginitis: Secondary | ICD-10-CM | POA: Diagnosis not present

## 2022-08-13 DIAGNOSIS — F419 Anxiety disorder, unspecified: Secondary | ICD-10-CM | POA: Diagnosis not present

## 2022-09-02 DIAGNOSIS — F419 Anxiety disorder, unspecified: Secondary | ICD-10-CM | POA: Diagnosis not present

## 2022-09-04 DIAGNOSIS — Z1159 Encounter for screening for other viral diseases: Secondary | ICD-10-CM | POA: Diagnosis not present

## 2022-09-04 DIAGNOSIS — N760 Acute vaginitis: Secondary | ICD-10-CM | POA: Diagnosis not present

## 2022-09-04 DIAGNOSIS — Z113 Encounter for screening for infections with a predominantly sexual mode of transmission: Secondary | ICD-10-CM | POA: Diagnosis not present

## 2022-09-04 DIAGNOSIS — L293 Anogenital pruritus, unspecified: Secondary | ICD-10-CM | POA: Diagnosis not present

## 2022-09-10 DIAGNOSIS — F419 Anxiety disorder, unspecified: Secondary | ICD-10-CM | POA: Diagnosis not present

## 2022-09-24 DIAGNOSIS — F419 Anxiety disorder, unspecified: Secondary | ICD-10-CM | POA: Diagnosis not present

## 2022-10-08 DIAGNOSIS — F419 Anxiety disorder, unspecified: Secondary | ICD-10-CM | POA: Diagnosis not present

## 2022-10-22 DIAGNOSIS — F419 Anxiety disorder, unspecified: Secondary | ICD-10-CM | POA: Diagnosis not present

## 2022-11-11 DIAGNOSIS — Z3202 Encounter for pregnancy test, result negative: Secondary | ICD-10-CM | POA: Diagnosis not present

## 2022-11-11 DIAGNOSIS — N898 Other specified noninflammatory disorders of vagina: Secondary | ICD-10-CM | POA: Diagnosis not present

## 2022-11-11 DIAGNOSIS — N760 Acute vaginitis: Secondary | ICD-10-CM | POA: Diagnosis not present

## 2022-11-12 DIAGNOSIS — F419 Anxiety disorder, unspecified: Secondary | ICD-10-CM | POA: Diagnosis not present

## 2022-12-07 DIAGNOSIS — Z113 Encounter for screening for infections with a predominantly sexual mode of transmission: Secondary | ICD-10-CM | POA: Diagnosis not present

## 2022-12-07 DIAGNOSIS — N760 Acute vaginitis: Secondary | ICD-10-CM | POA: Diagnosis not present

## 2022-12-08 DIAGNOSIS — F419 Anxiety disorder, unspecified: Secondary | ICD-10-CM | POA: Diagnosis not present

## 2022-12-11 DIAGNOSIS — F411 Generalized anxiety disorder: Secondary | ICD-10-CM | POA: Diagnosis not present

## 2022-12-11 DIAGNOSIS — F422 Mixed obsessional thoughts and acts: Secondary | ICD-10-CM | POA: Diagnosis not present

## 2022-12-11 DIAGNOSIS — F332 Major depressive disorder, recurrent severe without psychotic features: Secondary | ICD-10-CM | POA: Diagnosis not present

## 2022-12-15 DIAGNOSIS — N760 Acute vaginitis: Secondary | ICD-10-CM | POA: Diagnosis not present

## 2022-12-16 DIAGNOSIS — F422 Mixed obsessional thoughts and acts: Secondary | ICD-10-CM | POA: Diagnosis not present

## 2022-12-16 DIAGNOSIS — F332 Major depressive disorder, recurrent severe without psychotic features: Secondary | ICD-10-CM | POA: Diagnosis not present

## 2022-12-16 DIAGNOSIS — F411 Generalized anxiety disorder: Secondary | ICD-10-CM | POA: Diagnosis not present

## 2022-12-22 DIAGNOSIS — F332 Major depressive disorder, recurrent severe without psychotic features: Secondary | ICD-10-CM | POA: Diagnosis not present

## 2022-12-22 DIAGNOSIS — F422 Mixed obsessional thoughts and acts: Secondary | ICD-10-CM | POA: Diagnosis not present

## 2022-12-22 DIAGNOSIS — F411 Generalized anxiety disorder: Secondary | ICD-10-CM | POA: Diagnosis not present

## 2023-01-01 DIAGNOSIS — F422 Mixed obsessional thoughts and acts: Secondary | ICD-10-CM | POA: Diagnosis not present

## 2023-01-01 DIAGNOSIS — F332 Major depressive disorder, recurrent severe without psychotic features: Secondary | ICD-10-CM | POA: Diagnosis not present

## 2023-01-01 DIAGNOSIS — F411 Generalized anxiety disorder: Secondary | ICD-10-CM | POA: Diagnosis not present

## 2023-01-04 DIAGNOSIS — N898 Other specified noninflammatory disorders of vagina: Secondary | ICD-10-CM | POA: Diagnosis not present

## 2023-01-04 DIAGNOSIS — Z113 Encounter for screening for infections with a predominantly sexual mode of transmission: Secondary | ICD-10-CM | POA: Diagnosis not present

## 2023-01-04 DIAGNOSIS — A499 Bacterial infection, unspecified: Secondary | ICD-10-CM | POA: Diagnosis not present

## 2023-01-07 DIAGNOSIS — F411 Generalized anxiety disorder: Secondary | ICD-10-CM | POA: Diagnosis not present

## 2023-01-07 DIAGNOSIS — F422 Mixed obsessional thoughts and acts: Secondary | ICD-10-CM | POA: Diagnosis not present

## 2023-01-07 DIAGNOSIS — F332 Major depressive disorder, recurrent severe without psychotic features: Secondary | ICD-10-CM | POA: Diagnosis not present

## 2023-01-11 DIAGNOSIS — F332 Major depressive disorder, recurrent severe without psychotic features: Secondary | ICD-10-CM | POA: Diagnosis not present

## 2023-01-11 DIAGNOSIS — F411 Generalized anxiety disorder: Secondary | ICD-10-CM | POA: Diagnosis not present

## 2023-01-11 DIAGNOSIS — F422 Mixed obsessional thoughts and acts: Secondary | ICD-10-CM | POA: Diagnosis not present

## 2023-01-19 DIAGNOSIS — F411 Generalized anxiety disorder: Secondary | ICD-10-CM | POA: Diagnosis not present

## 2023-01-19 DIAGNOSIS — F332 Major depressive disorder, recurrent severe without psychotic features: Secondary | ICD-10-CM | POA: Diagnosis not present

## 2023-01-19 DIAGNOSIS — F422 Mixed obsessional thoughts and acts: Secondary | ICD-10-CM | POA: Diagnosis not present

## 2023-01-26 DIAGNOSIS — F411 Generalized anxiety disorder: Secondary | ICD-10-CM | POA: Diagnosis not present

## 2023-01-26 DIAGNOSIS — F332 Major depressive disorder, recurrent severe without psychotic features: Secondary | ICD-10-CM | POA: Diagnosis not present

## 2023-01-26 DIAGNOSIS — F422 Mixed obsessional thoughts and acts: Secondary | ICD-10-CM | POA: Diagnosis not present

## 2023-02-01 DIAGNOSIS — N898 Other specified noninflammatory disorders of vagina: Secondary | ICD-10-CM | POA: Diagnosis not present

## 2023-02-01 DIAGNOSIS — R8290 Unspecified abnormal findings in urine: Secondary | ICD-10-CM | POA: Diagnosis not present

## 2023-02-01 DIAGNOSIS — Z124 Encounter for screening for malignant neoplasm of cervix: Secondary | ICD-10-CM | POA: Diagnosis not present

## 2023-02-01 DIAGNOSIS — F411 Generalized anxiety disorder: Secondary | ICD-10-CM | POA: Diagnosis not present

## 2023-02-01 DIAGNOSIS — F422 Mixed obsessional thoughts and acts: Secondary | ICD-10-CM | POA: Diagnosis not present

## 2023-02-01 DIAGNOSIS — F332 Major depressive disorder, recurrent severe without psychotic features: Secondary | ICD-10-CM | POA: Diagnosis not present

## 2023-02-01 DIAGNOSIS — N760 Acute vaginitis: Secondary | ICD-10-CM | POA: Diagnosis not present

## 2023-02-09 DIAGNOSIS — F422 Mixed obsessional thoughts and acts: Secondary | ICD-10-CM | POA: Diagnosis not present

## 2023-02-09 DIAGNOSIS — F411 Generalized anxiety disorder: Secondary | ICD-10-CM | POA: Diagnosis not present

## 2023-02-09 DIAGNOSIS — F332 Major depressive disorder, recurrent severe without psychotic features: Secondary | ICD-10-CM | POA: Diagnosis not present

## 2023-02-10 DIAGNOSIS — F419 Anxiety disorder, unspecified: Secondary | ICD-10-CM | POA: Diagnosis not present

## 2023-02-16 DIAGNOSIS — F422 Mixed obsessional thoughts and acts: Secondary | ICD-10-CM | POA: Diagnosis not present

## 2023-02-16 DIAGNOSIS — F332 Major depressive disorder, recurrent severe without psychotic features: Secondary | ICD-10-CM | POA: Diagnosis not present

## 2023-02-16 DIAGNOSIS — F411 Generalized anxiety disorder: Secondary | ICD-10-CM | POA: Diagnosis not present

## 2023-02-17 DIAGNOSIS — Z113 Encounter for screening for infections with a predominantly sexual mode of transmission: Secondary | ICD-10-CM | POA: Diagnosis not present

## 2023-02-17 DIAGNOSIS — N898 Other specified noninflammatory disorders of vagina: Secondary | ICD-10-CM | POA: Diagnosis not present

## 2023-02-17 DIAGNOSIS — Z01419 Encounter for gynecological examination (general) (routine) without abnormal findings: Secondary | ICD-10-CM | POA: Diagnosis not present

## 2023-02-17 DIAGNOSIS — Z6823 Body mass index (BMI) 23.0-23.9, adult: Secondary | ICD-10-CM | POA: Diagnosis not present

## 2023-02-17 DIAGNOSIS — Z1159 Encounter for screening for other viral diseases: Secondary | ICD-10-CM | POA: Diagnosis not present

## 2023-02-18 DIAGNOSIS — F419 Anxiety disorder, unspecified: Secondary | ICD-10-CM | POA: Diagnosis not present

## 2023-02-26 DIAGNOSIS — F411 Generalized anxiety disorder: Secondary | ICD-10-CM | POA: Diagnosis not present

## 2023-02-26 DIAGNOSIS — F422 Mixed obsessional thoughts and acts: Secondary | ICD-10-CM | POA: Diagnosis not present

## 2023-02-26 DIAGNOSIS — F332 Major depressive disorder, recurrent severe without psychotic features: Secondary | ICD-10-CM | POA: Diagnosis not present

## 2023-03-08 DIAGNOSIS — F411 Generalized anxiety disorder: Secondary | ICD-10-CM | POA: Diagnosis not present

## 2023-03-08 DIAGNOSIS — F422 Mixed obsessional thoughts and acts: Secondary | ICD-10-CM | POA: Diagnosis not present

## 2023-03-08 DIAGNOSIS — F332 Major depressive disorder, recurrent severe without psychotic features: Secondary | ICD-10-CM | POA: Diagnosis not present

## 2023-03-15 DIAGNOSIS — F411 Generalized anxiety disorder: Secondary | ICD-10-CM | POA: Diagnosis not present

## 2023-03-15 DIAGNOSIS — F422 Mixed obsessional thoughts and acts: Secondary | ICD-10-CM | POA: Diagnosis not present

## 2023-03-15 DIAGNOSIS — F332 Major depressive disorder, recurrent severe without psychotic features: Secondary | ICD-10-CM | POA: Diagnosis not present

## 2023-03-25 DIAGNOSIS — F332 Major depressive disorder, recurrent severe without psychotic features: Secondary | ICD-10-CM | POA: Diagnosis not present

## 2023-03-25 DIAGNOSIS — F411 Generalized anxiety disorder: Secondary | ICD-10-CM | POA: Diagnosis not present

## 2023-03-25 DIAGNOSIS — F422 Mixed obsessional thoughts and acts: Secondary | ICD-10-CM | POA: Diagnosis not present

## 2023-04-01 DIAGNOSIS — F332 Major depressive disorder, recurrent severe without psychotic features: Secondary | ICD-10-CM | POA: Diagnosis not present

## 2023-04-01 DIAGNOSIS — F411 Generalized anxiety disorder: Secondary | ICD-10-CM | POA: Diagnosis not present

## 2023-04-01 DIAGNOSIS — F422 Mixed obsessional thoughts and acts: Secondary | ICD-10-CM | POA: Diagnosis not present

## 2023-04-06 DIAGNOSIS — F411 Generalized anxiety disorder: Secondary | ICD-10-CM | POA: Diagnosis not present

## 2023-04-06 DIAGNOSIS — F422 Mixed obsessional thoughts and acts: Secondary | ICD-10-CM | POA: Diagnosis not present

## 2023-04-06 DIAGNOSIS — F332 Major depressive disorder, recurrent severe without psychotic features: Secondary | ICD-10-CM | POA: Diagnosis not present

## 2023-04-12 DIAGNOSIS — F411 Generalized anxiety disorder: Secondary | ICD-10-CM | POA: Diagnosis not present

## 2023-04-12 DIAGNOSIS — F422 Mixed obsessional thoughts and acts: Secondary | ICD-10-CM | POA: Diagnosis not present

## 2023-04-12 DIAGNOSIS — F332 Major depressive disorder, recurrent severe without psychotic features: Secondary | ICD-10-CM | POA: Diagnosis not present

## 2023-04-19 DIAGNOSIS — F332 Major depressive disorder, recurrent severe without psychotic features: Secondary | ICD-10-CM | POA: Diagnosis not present

## 2023-04-19 DIAGNOSIS — F411 Generalized anxiety disorder: Secondary | ICD-10-CM | POA: Diagnosis not present

## 2023-04-19 DIAGNOSIS — F422 Mixed obsessional thoughts and acts: Secondary | ICD-10-CM | POA: Diagnosis not present

## 2023-05-03 DIAGNOSIS — F332 Major depressive disorder, recurrent severe without psychotic features: Secondary | ICD-10-CM | POA: Diagnosis not present

## 2023-05-03 DIAGNOSIS — F411 Generalized anxiety disorder: Secondary | ICD-10-CM | POA: Diagnosis not present

## 2023-05-03 DIAGNOSIS — F422 Mixed obsessional thoughts and acts: Secondary | ICD-10-CM | POA: Diagnosis not present

## 2023-05-10 DIAGNOSIS — F332 Major depressive disorder, recurrent severe without psychotic features: Secondary | ICD-10-CM | POA: Diagnosis not present

## 2023-05-10 DIAGNOSIS — F411 Generalized anxiety disorder: Secondary | ICD-10-CM | POA: Diagnosis not present

## 2023-05-10 DIAGNOSIS — F422 Mixed obsessional thoughts and acts: Secondary | ICD-10-CM | POA: Diagnosis not present

## 2023-05-17 DIAGNOSIS — N760 Acute vaginitis: Secondary | ICD-10-CM | POA: Diagnosis not present

## 2023-06-14 DIAGNOSIS — F411 Generalized anxiety disorder: Secondary | ICD-10-CM | POA: Diagnosis not present

## 2023-06-14 DIAGNOSIS — F422 Mixed obsessional thoughts and acts: Secondary | ICD-10-CM | POA: Diagnosis not present

## 2023-06-14 DIAGNOSIS — F332 Major depressive disorder, recurrent severe without psychotic features: Secondary | ICD-10-CM | POA: Diagnosis not present

## 2023-09-03 ENCOUNTER — Telehealth: Admitting: Family Medicine

## 2023-09-03 DIAGNOSIS — Z20828 Contact with and (suspected) exposure to other viral communicable diseases: Secondary | ICD-10-CM | POA: Diagnosis not present

## 2023-09-03 MED ORDER — OSELTAMIVIR PHOSPHATE 75 MG PO CAPS: 75.0000 mg | ORAL_CAPSULE | Freq: Every day | ORAL | 0 refills | Status: AC

## 2023-09-03 NOTE — Progress Notes (Signed)
 Virtual Visit Consent   Melissa Daniel, you are scheduled for a virtual visit with a [facility] provider today. Just as with appointments in the office, your consent must be obtained to participate. Your consent will be active for this visit and any virtual visit you may have with one of our providers in the next 365 days. If you have a MyChart account, a copy of this consent can be sent to you electronically.  As this is a virtual visit, video technology does not allow for your provider to perform a traditional examination. This may limit your provider's ability to fully assess your condition. If your provider identifies any concerns that need to be evaluated in person or the need to arrange testing (such as labs, EKG, etc.), we will make arrangements to do so. Although advances in technology are sophisticated, we cannot ensure that it will always work on either your end or our end. If the connection with a video visit is poor, the visit may have to be switched to a telephone visit. With either a video or telephone visit, we are not always able to ensure that we have a secure connection.  By engaging in this virtual visit, you consent to the provision of healthcare and authorize for your insurance to be billed (if applicable) for the services provided during this visit. Depending on your insurance coverage, you may receive a charge related to this service.  I need to obtain your verbal consent now. Are you willing to proceed with your visit today? Melissa Daniel has provided verbal consent on 09/03/2023 for a virtual visit (video or telephone). Melissa Curio, FNP  Date: 09/03/2023 10:27 AM   Virtual Visit via Video Note   I, Melissa Daniel, connected with  Melissa Daniel  ([id number], 2001-05-14) on 09/03/23 at 10:15 AM EDT by a video-enabled telemedicine application and verified that I am speaking with the correct person using two identifiers.  Location: Patient: Virtual Visit Location Patient:  Home Provider: Virtual Visit Location Provider: Home Office   I discussed the limitations of evaluation and management by telemedicine and the availability of in person appointments. The patient expressed understanding and agreed to proceed.    History of Present Illness: Melissa Daniel is a 22 y.o. 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 She would like prophylaxis. Marland Kitchen  HPI: HPI  Problems:  Patient Active Problem List   Diagnosis Date Noted   Gas pain 12/10/2020   Gastroesophageal reflux disease 12/10/2020   Constipation 11/06/2020   Abdominal cramping 11/06/2020   Bloating 11/06/2020   Lower abdominal pain 11/06/2020   Nausea without vomiting 11/06/2020   Pyrosis 11/06/2020   Abnormal endocrine laboratory test finding 01/03/2016   Father with liver failure 12/22/2011   Central auditory processing disorder    Adolescent idiopathic scoliosis of thoracic region 11/26/2010   Anxiety 11/26/2010    Allergies: No Known Allergies Medications:  Current Outpatient Medications:    buPROPion (WELLBUTRIN XL) 150 MG 24 hr tablet, Take 150 mg by mouth daily., Disp: , Rfl:    diphenhydrAMINE (BENADRYL) 25 mg capsule, , Disp: , Rfl:    escitalopram (LEXAPRO) 5 MG tablet, Take 5 mg by mouth daily., Disp: , Rfl:    ibuprofen (ADVIL,MOTRIN) 200 MG tablet, Take 200 mg by mouth as needed. Reported on 01/03/2016, Disp: , Rfl:    levonorgestrel (MIRENA) 20 MCG/DAY IUD, 1 each by  Intrauterine route once., Disp: , Rfl:    Omega-3 Fatty Acids (FISH OIL PO), Take by mouth daily., Disp: , Rfl:    omeprazole (PRILOSEC) 20 MG capsule, Take 20 mg by mouth daily., Disp: , Rfl:   Observations/Objective: Patient is well-developed, well-nourished in no acute distress.  Resting comfortably  at home.  Head is normocephalic, atraumatic.  No labored breathing.  Speech is clear and coherent with logical content.  Patient is alert  and oriented at baseline.    Assessment and Plan: 1. Exposure to influenza (Primary)  Increase fluids, humidifier at night, UC as needed.   Follow Up Instructions: I discussed the assessment and treatment plan with the patient. The patient was provided an opportunity to ask questions and all were answered. The patient agreed with the plan and demonstrated an understanding of the instructions.  A copy of instructions were sent to the patient via MyChart unless otherwise noted below.     The patient was advised to call back or seek an in-person evaluation if the symptoms worsen or if the condition fails to improve as anticipated.    Melissa Curio, FNP

## 2023-09-03 NOTE — Patient Instructions (Signed)

## 2023-09-08 ENCOUNTER — Ambulatory Visit (HOSPITAL_COMMUNITY)
Admission: RE | Admit: 2023-09-08 | Discharge: 2023-09-08 | Disposition: A | Discharge status: Home / Self Care | Payer: Self-pay | Source: Ambulatory Visit | Attending: Nurse Practitioner | Admitting: Nurse Practitioner

## 2023-09-08 ENCOUNTER — Encounter (HOSPITAL_COMMUNITY): Payer: Self-pay

## 2023-09-08 VITALS — BP 120/79 | HR 92 | Temp 98.0°F | Resp 15

## 2023-09-08 DIAGNOSIS — J101 Influenza due to other identified influenza virus with other respiratory manifestations: Secondary | ICD-10-CM | POA: Diagnosis not present

## 2023-09-08 LAB — POC COVID19/FLU A&B COMBO
Covid Antigen, POC: NEGATIVE
Influenza A Antigen, POC: NEGATIVE
Influenza B Antigen, POC: POSITIVE — AB

## 2023-09-08 NOTE — ED Provider Notes (Signed)
 [facility]    CSN: [id number] Arrival date & time: 09/08/23  1318      History   Chief Complaint Chief Complaint  Patient presents with   Appointment    HPI Melissa Daniel is a 23 y.o. female.   Patient presents today with 4 to 5-day history of bodyaches, congested cough, chest and nasal congestion, runny nose, sore throat, ear pain, decreased appetite, and fatigue.  No known fevers, shortness of breath or chest pain, headache, abdominal pain, nausea/vomiting, or diarrhea.  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  She has been taking DayQuil and NyQuil which seems to help with symptoms.    Past Medical History:  Diagnosis Date   Anxiety    Central auditory processing disorder    audiological, OT, full psych eval in 2008-2009   Depression    Idiopathic scoliosis    mild   Problems with learning    CAP, has tutor    Patient Active Problem List   Diagnosis Date Noted   Gas pain 12/10/2020   Gastroesophageal reflux disease 12/10/2020   Constipation 11/06/2020   Abdominal cramping 11/06/2020   Bloating 11/06/2020   Lower abdominal pain 11/06/2020   Nausea without vomiting 11/06/2020   Pyrosis 11/06/2020   Abnormal endocrine laboratory test finding 01/03/2016   Father with liver failure 12/22/2011   Central auditory processing disorder    Adolescent idiopathic scoliosis of thoracic region 11/26/2010   Anxiety 11/26/2010    Past Surgical History:  Procedure Laterality Date   ADENOIDECTOMY     TYMPANOSTOMY TUBE PLACEMENT  2003   WISDOM TOOTH EXTRACTION      OB History   No obstetric history on file.      Home Medications    Prior to Admission medications   Medication Sig Start Date End Date Taking? Authorizing Provider  buPROPion (WELLBUTRIN XL) 150 MG 24 hr tablet Take 150 mg by mouth daily.    [provider]  diphenhydrAMINE (BENADRYL) 25 mg capsule      [provider]  escitalopram (LEXAPRO) 5 MG tablet Take 5 mg by mouth daily. 12/11/20   [provider]  ibuprofen (ADVIL,MOTRIN) 200 MG tablet Take 200 mg by mouth as needed. Reported on 01/03/2016    [provider]  levonorgestrel (MIRENA) 20 MCG/DAY IUD 1 each by Intrauterine route once.    [provider]  Omega-3 Fatty Acids (FISH OIL PO) Take by mouth daily.    [provider]  omeprazole (PRILOSEC) 20 MG capsule Take 20 mg by mouth daily.    [provider]  oseltamivir (TAMIFLU) 75 MG capsule Take 1 capsule (75 mg total) by mouth daily for 10 days. 09/03/23 09/13/23  Delorse Lek, FNP    Family History Family History  Problem Relation Age of Onset   Hypertension Father    Liver disease Father    Seizures Father    Ankylosing spondylitis Father    Lupus Paternal Grandmother    Alzheimer's disease Maternal Grandmother    Pancreatic cancer Neg Hx    Stomach cancer Neg Hx    Esophageal cancer Neg Hx    Colon cancer Neg Hx    Inflammatory bowel disease Neg Hx    Rectal cancer Neg Hx     Social History Social History   Tobacco Use   Smoking status: Never   Smokeless tobacco: Never  Vaping Use   Vaping status: Former  Substance Use Topics   Alcohol use: Yes    Comment: rare 1 drink   Drug use: No     Allergies   Patient has no known allergies.   Review of Systems Review of Systems Per HPI  Physical Exam Triage Vital Signs ED Triage Vitals  Encounter Vitals Group     BP 09/08/23 1339 120/79     Systolic BP Percentile --      Diastolic BP Percentile --      Pulse Rate 09/08/23 1339 92     Resp 09/08/23 1339 15     Temp 09/08/23 1339 98 F (36.7 C)     Temp Source 09/08/23 1339 Oral     SpO2 09/08/23 1339 97 %     Weight --      Height --      Head Circumference --      Peak Flow --      Pain Score 09/08/23 1338 0     Pain Loc --      Pain Education --      Exclude from Growth Chart --    No  data found.  Updated Vital Signs BP 120/79 (BP Location: Right Arm)   Pulse 92   Temp 98 F (36.7 C) (Oral)   Resp 15   LMP 08/15/2023   SpO2 97%   Visual Acuity Right Eye Distance:   Left Eye Distance:   Bilateral Distance:    Right Eye Near:   Left Eye Near:    Bilateral Near:     Physical Exam Vitals and nursing note reviewed.  Constitutional:      General: She is not in acute distress.    Appearance: Normal appearance. She is not ill-appearing or toxic-appearing.  HENT:     Head: Normocephalic and atraumatic.     Right Ear: Tympanic membrane, ear canal and external ear normal.     Left Ear: Tympanic membrane, ear canal and external ear normal.     Nose: Congestion present. No rhinorrhea.     Mouth/Throat:     Mouth: Mucous membranes are moist.     Pharynx: Oropharynx is clear. No oropharyngeal exudate or posterior oropharyngeal erythema.  Eyes:     General: No scleral icterus.    Extraocular Movements: Extraocular movements intact.  Cardiovascular:     Rate and Rhythm: Normal rate and regular rhythm.  Pulmonary:     Effort: Pulmonary effort is normal. No respiratory distress.     Breath sounds: Normal breath sounds. No wheezing, rhonchi or rales.  Musculoskeletal:     Cervical back: Normal range of motion and neck supple.  Lymphadenopathy:     Cervical: No cervical adenopathy.  Skin:    General: Skin is warm and dry.     Coloration: Skin is not jaundiced or pale.     Findings: No erythema or rash.  Neurological:     Mental Status: She is alert and oriented to person, place, and time.  Psychiatric:        Behavior: Behavior is cooperative.      UC Treatments / Results  Labs (all labs ordered are listed, but only abnormal results are displayed) Labs Reviewed  POC COVID19/FLU A&B COMBO - Abnormal; Notable for the following components:      Result Value   Influenza B Antigen, POC Positive (*)    All other components within normal limits     EKG   Radiology  No results found.  Procedures Procedures (including critical care time)  Medications Ordered in UC Medications - No data to display  Initial Impression / Assessment and Plan / UC Course  I have reviewed the triage vital signs and the nursing notes.  Pertinent labs & imaging results that were available during my care of the patient were reviewed by me and considered in my medical decision making (see chart for details).   Patient is well-appearing, normotensive, afebrile, not tachycardic, not tachypneic, oxygenating well on room air.    1. Influenza B Patient is out of window for treatment with Tamiflu Vitals and exam are reassuring today Supportive care discussed Return and ER precautions discussed  The patient was given the opportunity to ask questions.  All questions answered to their satisfaction.  The patient is in agreement to this plan.    Final Clinical Impressions(s) / UC Diagnoses   Final diagnoses:  Influenza B     Discharge Instructions      You tested positive for influenza B today.  Symptoms should improve over the next week or so.  If you develop chest pain or shortness of breath, please seek care emergently.  Some things that can make you feel better are: - Increased rest - Increasing fluid with water/sugar free electrolytes - Acetaminophen and ibuprofen as needed for fever/pain - Salt water gargling, chloraseptic spray and throat lozenges - OTC guaifenesin (Mucinex) 600 mg twice daily - Saline sinus flushes or a neti pot - Humidifying the air   ED Prescriptions   None    PDMP not reviewed this encounter.   Valentino Nose, NP 09/08/23 [phone]

## 2023-09-08 NOTE — Discharge Instructions (Signed)
 You tested positive for influenza B today.  Symptoms should improve over the next week or so.  If you develop chest pain or shortness of breath, please seek care emergently.  Some things that can make you feel better are: - Increased rest - Increasing fluid with water/sugar free electrolytes - Acetaminophen and ibuprofen as needed for fever/pain - Salt water gargling, chloraseptic spray and throat lozenges - OTC guaifenesin (Mucinex) 600 mg twice daily - Saline sinus flushes or a neti pot - Humidifying the air

## 2023-09-08 NOTE — ED Triage Notes (Signed)
 Pt reports week ago was exposed to flu. Reports did a tele-visit last Friday and was prescribed Tamiflu to take to try to prevent flu symptoms. Reports picked up from pharmacy but didn't take it. Reports 4 days ago started having sore throat and congestion. Took Dayquil, Nyquil.

## 2023-10-25 IMAGING — MR MR HEAD WO/W CM
15 series · 48 of 48 positions shown · IV contrast (multihance)
Comparison: No pertinent prior exams available for comparison.

CLINICAL DATA: Provided history: Dizziness. Muscle twitching.
Dizziness, nonspecific. Additional history provided by scanning
technologist: Patient reports dizziness, muscle twitching (bilateral
shoulders and arms) for 3 months.

EXAM:
MRI HEAD WITHOUT AND WITH CONTRAST
TECHNIQUE: Multiplanar, multiecho pulse sequences of the brain and surrounding
structures were obtained without and with intravenous contrast.
CONTRAST:  15mL MULTIHANCE GADOBENATE DIMEGLUMINE 529 MG/ML IV SOLN

[Series 5: T1 · sagittal · 4.0mm · 0.75mm/px · 2 of 33 slices shown (1 of 3)]
[im 1/33]
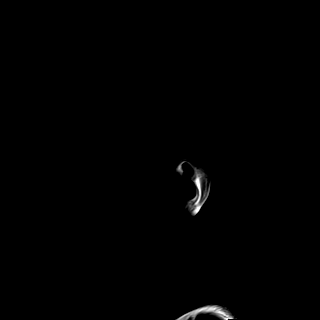
[im 33/33]
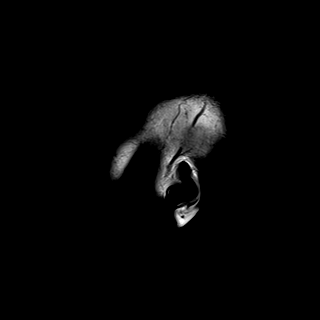

[Series 6: DWI · axial · 3.0mm · 0.94mm/px · z∈[-51,+88]mm · 8 of 160 slices shown (1 of 3)]
[im 1/160]
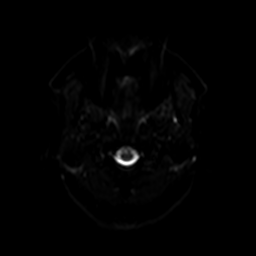
[im 23/160]
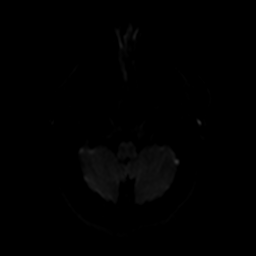
[im 46/160]
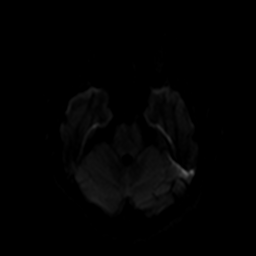
[im 69/160]
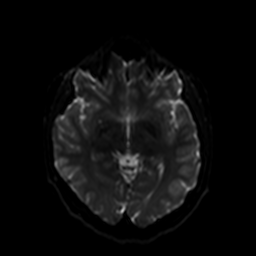
[im 91/160]
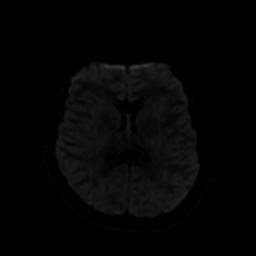
[im 114/160]
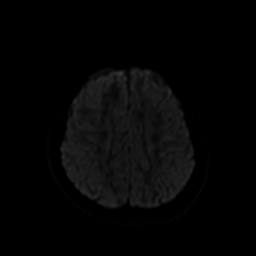
[im 137/160]
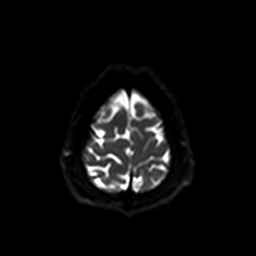
[im 160/160]
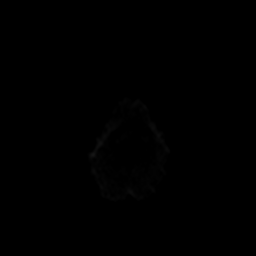

[Series 7: ax dwi_tracew · axial · 3.0mm · 0.94mm/px · z∈[-51,+88]mm · 5 of 80 slices shown]
[im 1/80]
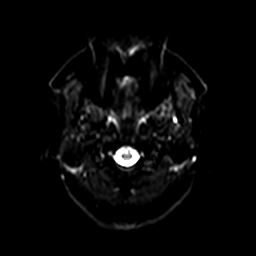
[im 20/80]
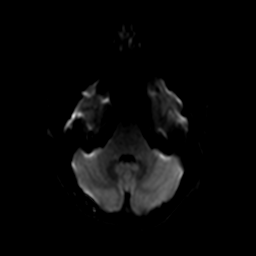
[im 40/80]
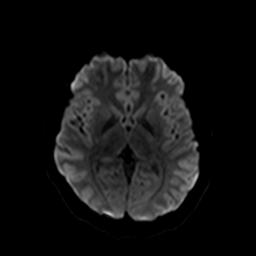
[im 60/80]
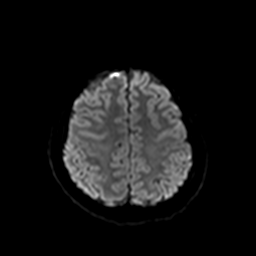
[im 80/80]
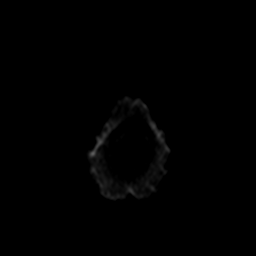

[Series 8: ax dwi_adc · axial · 3.0mm · 0.94mm/px · z∈[-51,+88]mm · 2 of 40 slices shown]
[im 1/40]
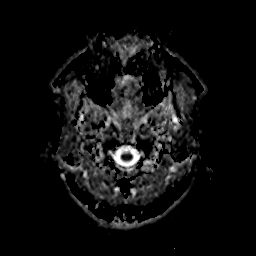
[im 40/40]
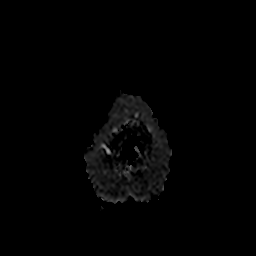

[Series 9: DWI · coronal · 5.0mm · 1.44mm/px · 3 of 60 slices shown (2 of 3)]
[im 1/60]
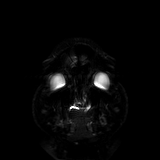
[im 30/60]
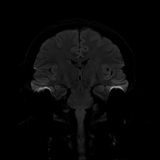
[im 60/60]
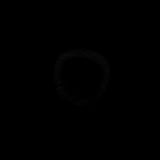

[Series 10: DWI · coronal · 5.0mm · 1.44mm/px · 1 of 30 slices shown (3 of 3)]
[im 1/30]
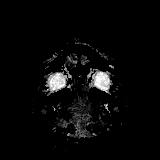

[Series 11: T2 · axial · 4.0mm · 0.36mm/px · 1 of 29 slices shown]
[im 1/29]
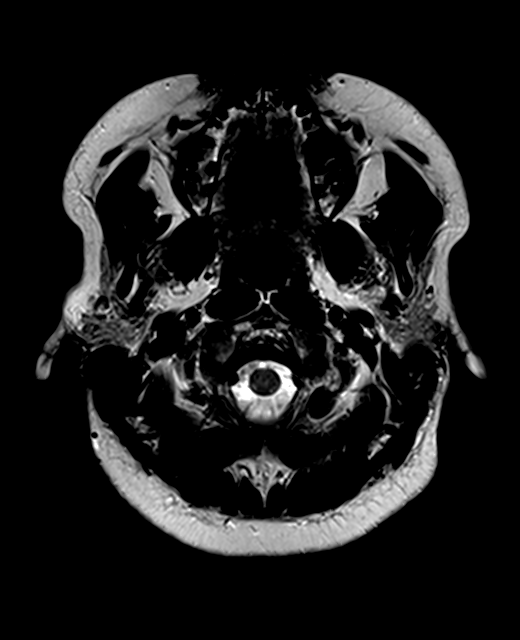

[Series 12: FLAIR · axial · 3.0mm · 0.72mm/px · 1 of 26 slices shown]
[im 1/26]
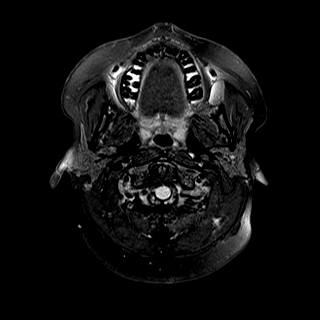

[Series 13: swi_images · axial · 1.5mm · 0.90mm/px · z∈[-54,+88]mm · 4 of 96 slices shown]
[im 1/96]
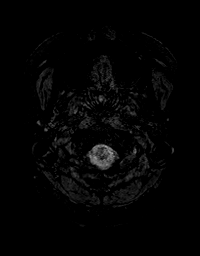
[im 32/96]
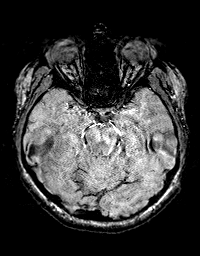
[im 64/96]
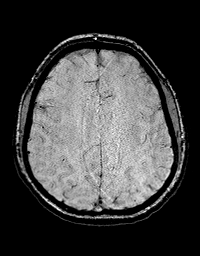
[im 96/96]
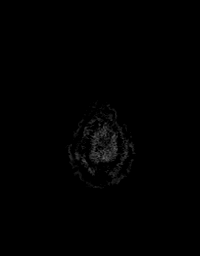

[Series 14: mip_images(sw) · axial · 12.0mm · 0.90mm/px · z∈[-48,+83]mm · 4 of 89 slices shown]
[im 1/89]
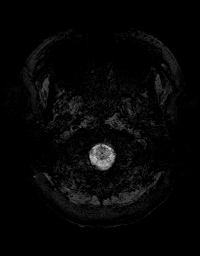
[im 30/89]
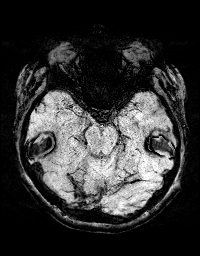
[im 59/89]
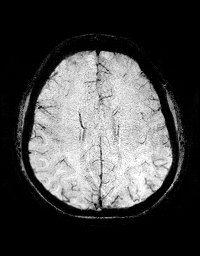
[im 89/89]
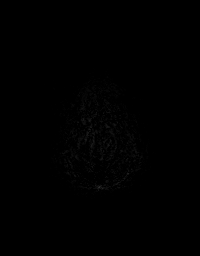

[Series 15: T1 · axial · 1.0mm · 0.94mm/px · z∈[-61,+96]mm · 7 of 160 slices shown (2 of 3)]
[im 1/160]
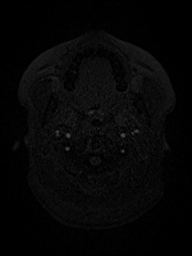
[im 27/160]
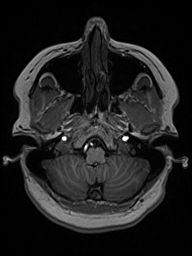
[im 54/160]
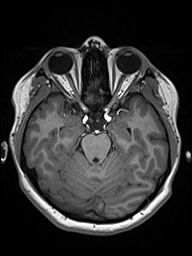
[im 80/160]
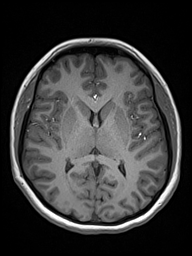
[im 107/160]
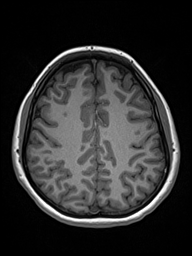
[im 133/160]
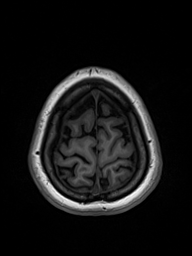
[im 160/160]
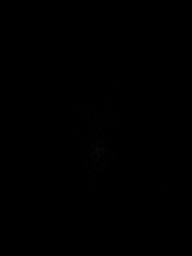

[Series 16: T2 post-contrast · coronal · 4.0mm · 0.36mm/px · 1 of 33 slices shown]
[im 1/33]
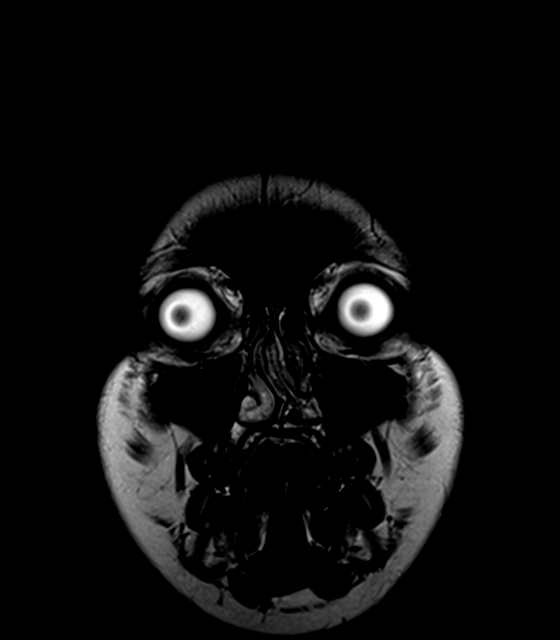

[Series 17: T1 · axial · 1.0mm · 0.94mm/px · z∈[-61,+96]mm · 7 of 160 slices shown (3 of 3)]
[im 1/160]
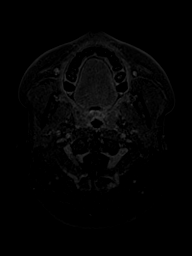
[im 27/160]
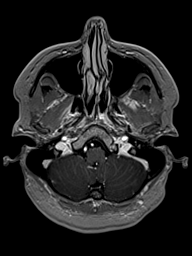
[im 54/160]
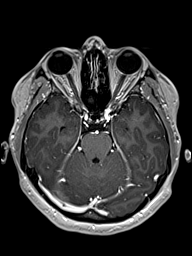
[im 80/160]
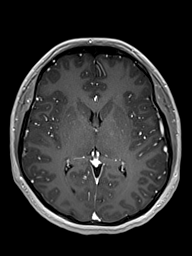
[im 107/160]
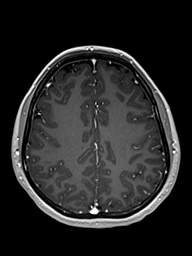
[im 133/160]
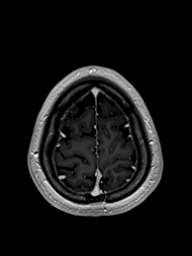
[im 160/160]
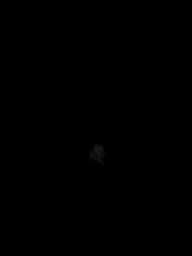

[Series 18: T1 post-contrast · coronal · 4.0mm · 0.72mm/px · 1 of 33 slices shown (1 of 2)]
[im 1/33]
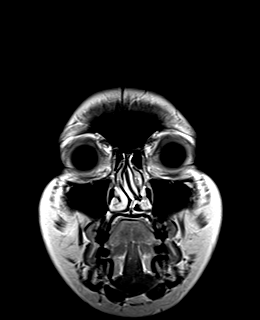

[Series 19: T1 post-contrast · sagittal · 4.0mm · 0.75mm/px · 1 of 33 slices shown (2 of 2)]
[im 1/33]
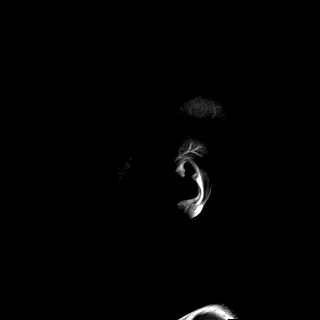

[48 of 48 positions shown; findings below may reference images not displayed]

FINDINGS: Brain:

Cerebral volume is normal.

There are 3 to 4 nonspecific punctate T2 FLAIR hyperintense remote
insults within the anterior right frontal lobe and right subinsular
white matter (series 12, images 16, 15, 14).

No cortical encephalomalacia is identified.

There is no acute infarct.

No evidence of an intracranial mass.

No chronic intracranial blood products.

No extra-axial fluid collection.

No midline shift.

No pathologic intracranial enhancement identified.

Vascular: Maintained flow voids within the proximal large arterial
vessels.

Skull and upper cervical spine: No focal suspicious marrow lesion.

Sinuses/Orbits: Fluid versus mucous retention cyst within a left
ethmoid air cell (series 11, image 10).
IMPRESSION: No evidence of acute intracranial abnormality.

There are 3 to 4 punctate nonspecific T2 FLAIR hyperintense remote
insults within the anterior right frontal lobe white matter and
right subinsular white matter. These signal changes are nonspecific
and differential considerations include age-advanced chronic small
vessel ischemic disease, sequela of a prior infectious/inflammatory
process, sequela of chronic migraine headaches or sequela of a
demyelinating process (such multiple sclerosis), among others.

Otherwise unremarkable MRI appearance of the brain.

Mild left ethmoid disease, as described.
